# Patient Record
Sex: Female | Born: 1975 | Race: White | Hispanic: Yes | Marital: Married | Smoking: Never smoker
Health system: Southern US, Community
[De-identification: ages and names within clinical notes are randomized; demographics above are authoritative.]

## PROBLEM LIST (undated history)

## (undated) DIAGNOSIS — F329 Major depressive disorder, single episode, unspecified: Secondary | ICD-10-CM

## (undated) DIAGNOSIS — F419 Anxiety disorder, unspecified: Secondary | ICD-10-CM

## (undated) DIAGNOSIS — F32A Depression, unspecified: Secondary | ICD-10-CM

## (undated) DIAGNOSIS — K219 Gastro-esophageal reflux disease without esophagitis: Secondary | ICD-10-CM

## (undated) DIAGNOSIS — E079 Disorder of thyroid, unspecified: Secondary | ICD-10-CM

## (undated) DIAGNOSIS — Z8632 Personal history of gestational diabetes: Secondary | ICD-10-CM

## (undated) HISTORY — PX: BREAST REDUCTION SURGERY: SHX8

## (undated) HISTORY — DX: Personal history of gestational diabetes: Z86.32

## (undated) HISTORY — PX: SPINE SURGERY: SHX786

## (undated) HISTORY — DX: Anxiety disorder, unspecified: F41.9

## (undated) HISTORY — DX: Major depressive disorder, single episode, unspecified: F32.9

## (undated) HISTORY — DX: Gastro-esophageal reflux disease without esophagitis: K21.9

## (undated) HISTORY — PX: MOUTH SURGERY: SHX715

## (undated) HISTORY — DX: Depression, unspecified: F32.A

---

## 2006-08-01 HISTORY — PX: REDUCTION MAMMAPLASTY: SUR839

## 2014-08-06 ENCOUNTER — Encounter (HOSPITAL_COMMUNITY): Payer: Self-pay

## 2014-08-06 ENCOUNTER — Emergency Department (HOSPITAL_COMMUNITY)
Admission: EM | Admit: 2014-08-06 | Discharge: 2014-08-07 | Disposition: A | Discharge status: Home / Self Care | Payer: BLUE CROSS/BLUE SHIELD | Attending: Emergency Medicine | Admitting: Emergency Medicine

## 2014-08-06 ENCOUNTER — Emergency Department (HOSPITAL_COMMUNITY): Payer: BLUE CROSS/BLUE SHIELD

## 2014-08-06 DIAGNOSIS — Z3202 Encounter for pregnancy test, result negative: Secondary | ICD-10-CM | POA: Diagnosis not present

## 2014-08-06 DIAGNOSIS — Z8639 Personal history of other endocrine, nutritional and metabolic disease: Secondary | ICD-10-CM | POA: Diagnosis not present

## 2014-08-06 DIAGNOSIS — R0789 Other chest pain: Secondary | ICD-10-CM | POA: Diagnosis not present

## 2014-08-06 DIAGNOSIS — R079 Chest pain, unspecified: Secondary | ICD-10-CM | POA: Diagnosis present

## 2014-08-06 HISTORY — DX: Disorder of thyroid, unspecified: E07.9

## 2014-08-06 LAB — BASIC METABOLIC PANEL
Anion gap: 5 (ref 5–15)
BUN: 16 mg/dL (ref 6–23)
CALCIUM: 8.7 mg/dL (ref 8.4–10.5)
CO2: 24 mmol/L (ref 19–32)
Chloride: 105 mEq/L (ref 96–112)
Creatinine, Ser: 0.75 mg/dL (ref 0.50–1.10)
GFR calc Af Amer: >90 mL/min (ref >90)
GFR calc non Af Amer: >90 mL/min (ref >90)
GLUCOSE: 115 mg/dL — AB (ref 70–99)
Potassium: 3.4 mmol/L — ABNORMAL LOW (ref 3.5–5.1)
SODIUM: 134 mmol/L — AB (ref 135–145)

## 2014-08-06 LAB — I-STAT TROPONIN, ED: Troponin i, poc: 0 ng/mL (ref 0.00–0.08)

## 2014-08-06 LAB — CBC
HEMATOCRIT: 37.5 % (ref 36.0–46.0)
Hemoglobin: 12.2 g/dL (ref 12.0–15.0)
MCH: 28.3 pg (ref 26.0–34.0)
MCHC: 32.5 g/dL (ref 30.0–36.0)
MCV: 87 fL (ref 78.0–100.0)
PLATELETS: 222 10*3/uL (ref 150–400)
RBC: 4.31 MIL/uL (ref 3.87–5.11)
RDW: 12.7 % (ref 11.5–15.5)
WBC: 7.8 10*3/uL (ref 4.0–10.5)

## 2014-08-06 LAB — POC URINE PREG, ED: Preg Test, Ur: NEGATIVE

## 2014-08-06 MED ORDER — GI COCKTAIL ~~LOC~~
30.0000 mL | Freq: Once | ORAL | Status: AC
  Administered 2014-08-06: 30 mL via ORAL
  Filled 2014-08-06: qty 30

## 2014-08-06 MED ORDER — IBUPROFEN 200 MG PO TABS
400.0000 mg | ORAL_TABLET | Freq: Once | ORAL | Status: AC
  Administered 2014-08-06: 400 mg via ORAL
  Filled 2014-08-06: qty 2

## 2014-08-06 MED ORDER — FAMOTIDINE 20 MG PO TABS
20.0000 mg | ORAL_TABLET | Freq: Once | ORAL | Status: AC
  Administered 2014-08-06: 20 mg via ORAL
  Filled 2014-08-06: qty 1

## 2014-08-06 NOTE — ED Provider Notes (Signed)
CSN: [id number]     Arrival date & time 08/06/14  2153 History   First MD Initiated Contact with Patient 08/06/14 2215     Chief Complaint  Patient presents with  . Chest Pain     (Consider location/radiation/quality/duration/timing/severity/associated sxs/prior Treatment) Patient is a 39 y.o. female presenting with chest pain. The history is provided by the patient.  Chest Pain Associated symptoms: no abdominal pain, no back pain, no cough, no fever, no headache, no nausea, no palpitations, no shortness of breath and not vomiting   pt c/o sharp, localized, pinching type pain lower sternum, just right of midline. Constant. Dull. Non radiating. At times worse w changes in position/certain movements. At another point felt like moved up towards throat. Has been constant, at rest, although states did not interfere w her normal sleep last night.  No relation to activity or exertion. No change whether sitting upright or lying supine. No relation to eating. No hx same pain. Pain is not pleuritic. No associated sob, nv or diaphoresis. No unusual doe or fatigue. Pt denies hx gerd or heartburn. No recent febrile/viral illness. No cough or uri c/o.  Pt denies leg pain or swelling. No recent surgery, immobility or trauma. Non smoker. No hx dvt or pe. No fam hx cad. Pt denies chest wall injury or strain. Pt denies abd pain. No hx gallstones.   Past Medical History  Diagnosis Date  . Thyroid disease    History reviewed. No pertinent past surgical history. History reviewed. No pertinent family history. History  Substance Use Topics  . Smoking status: Never Smoker   . Smokeless tobacco: Not on file  . Alcohol Use: No   OB History    No data available     Review of Systems  Constitutional: Negative for fever and chills.  HENT: Negative for sore throat.   Eyes: Negative for redness.  Respiratory: Negative for cough and shortness of breath.   Cardiovascular: Positive for chest pain. Negative for  palpitations and leg swelling.  Gastrointestinal: Negative for nausea, vomiting and abdominal pain.  Genitourinary: Negative for flank pain.  Musculoskeletal: Negative for back pain and neck pain.  Skin: Negative for rash.  Neurological: Negative for headaches.  Hematological: Does not bruise/bleed easily.  Psychiatric/Behavioral: Negative for confusion.      Allergies  Review of patient's allergies indicates no known allergies.  Home Medications   Prior to Admission medications   Not on File   BP 127/72 mmHg  Pulse 85  Temp(Src) 97.6 F (36.4 C) (Oral)  Resp 18  Ht  (1.651 m)  Wt 170 lb (77.111 kg)  BMI 28.29 kg/m2  SpO2 100%  LMP 08/06/2014 Physical Exam  Constitutional: She is oriented to person, place, and time. She appears well-developed and well-nourished. No distress.  Eyes: Conjunctivae are normal. No scleral icterus.  Neck: Neck supple. No tracheal deviation present.  Cardiovascular: Normal rate, regular rhythm, normal heart sounds and intact distal pulses.  Exam reveals no gallop and no friction rub.   No murmur heard. Pulmonary/Chest: Effort normal and breath sounds normal. No respiratory distress. She exhibits tenderness.  Abdominal: Soft. Normal appearance and bowel sounds are normal. She exhibits no distension and no mass. There is no tenderness. There is no rebound and no guarding.  Musculoskeletal: She exhibits no edema or tenderness.  Neurological: She is alert and oriented to person, place, and time.  Skin: Skin is warm and dry. No rash noted. She is not diaphoretic.  Psychiatric: She has  a normal mood and affect.  Nursing note and vitals reviewed.   ED Course  Procedures (including critical care time) Labs Review  Results for orders placed or performed during the hospital encounter of 08/06/14  CBC  Result Value Ref Range   WBC 7.8 4.0 - 10.5 K/uL   RBC 4.31 3.87 - 5.11 MIL/uL   Hemoglobin 12.2 12.0 - 15.0 g/dL   HCT 16.137.5 09.636.0 - 04.546.0 %    MCV 87.0 78.0 - 100.0 fL   MCH 28.3 26.0 - 34.0 pg   MCHC 32.5 30.0 - 36.0 g/dL   RDW 40.912.7 81.111.5 - 91.415.5 %   Platelets 222 150 - 400 K/uL  I-stat troponin, ED (not [facility])  Result Value Ref Range   Troponin i, poc 0.00 0.00 - 0.08 ng/mL   Comment 3          POC Urine Pregnancy, ED (pre-menopausal females) - do not order at [facility]  Result Value Ref Range   Preg Test, Ur NEGATIVE NEGATIVE   Dg Chest Port 1 View  08/06/2014   CLINICAL DATA:  Mid upper chest pain for several hours.  EXAM: PORTABLE CHEST - 1 VIEW  COMPARISON:  None.  FINDINGS: Calcified granuloma at the left lung base. The cardiomediastinal contours are normal. The lungs are clear. Pulmonary vasculature is normal. No consolidation, pleural effusion, or pneumothorax. No acute osseous abnormalities are seen.  IMPRESSION: No acute pulmonary process. Calcified granuloma at the left lung base.   Electronically Signed   By: Rubye OaksMelanie  Ehinger M.D.   On: 08/06/2014 22:35       EKG Interpretation   Date/Time:  Wednesday August 06 2014 22:03:05 EST Ventricular Rate:  71 PR Interval:  144 QRS Duration: 102 QT Interval:  401 QTC Calculation: 436 R Axis:   61 Text Interpretation:  Sinus rhythm No previous tracing Confirmed by Denton LankSTEINL   MD, Caryn BeeKEVIN ([id number]) on 08/06/2014 10:26:07 PM      MDM   Labs. Cxr.  Reviewed nursing notes and prior charts for additional history.   Will try motrin, pepcid and gi cocktail for symptom relief/improvement.  After symptoms since yesterday, trop neg/zero.  Pt comfortable, and appears stable for d/c.     Suzi RootsKevin E Mannie Ohlin, MD 08/06/14 [phone]

## 2014-08-06 NOTE — Discharge Instructions (Signed)
It was our pleasure to provide your ER care today - we hope that you feel better.  Take motrin or aleve as need for pain.  Follow up with primary care doctor in the coming week.  Return to ER right away if worse, persistent or recurrent chest pain, trouble breathing, severe abdominal pain, fevers, other concern.      Chest Pain (Nonspecific) It is often hard to give a specific diagnosis for the cause of chest pain. There is always a chance that your pain could be related to something serious, such as a heart attack or a blood clot in the lungs. You need to follow up with your health care provider for further evaluation. CAUSES   Heartburn.  Pneumonia or bronchitis.  Anxiety or stress.  Inflammation around your heart (pericarditis) or lung (pleuritis or pleurisy).  A blood clot in the lung.  A collapsed lung (pneumothorax). It can develop suddenly on its own (spontaneous pneumothorax) or from trauma to the chest.  Shingles infection (herpes zoster virus). The chest wall is composed of bones, muscles, and cartilage. Any of these can be the source of the pain.  The bones can be bruised by injury.  The muscles or cartilage can be strained by coughing or overwork.  The cartilage can be affected by inflammation and become sore (costochondritis). DIAGNOSIS  Lab tests or other studies may be needed to find the cause of your pain. Your health care provider may have you take a test called an ambulatory electrocardiogram (ECG). An ECG records your heartbeat patterns over a 24-hour period. You may also have other tests, such as:  Transthoracic echocardiogram (TTE). During echocardiography, sound waves are used to evaluate how blood flows through your heart.  Transesophageal echocardiogram (TEE).  Cardiac monitoring. This allows your health care provider to monitor your heart rate and rhythm in real time.  Holter monitor. This is a portable device that records your heartbeat and can  help diagnose heart arrhythmias. It allows your health care provider to track your heart activity for several days, if needed.  Stress tests by exercise or by giving medicine that makes the heart beat faster. TREATMENT   Treatment depends on what may be causing your chest pain. Treatment may include:  Acid blockers for heartburn.  Anti-inflammatory medicine.  Pain medicine for inflammatory conditions.  Antibiotics if an infection is present.  You may be advised to change lifestyle habits. This includes stopping smoking and avoiding alcohol, caffeine, and chocolate.  You may be advised to keep your head raised (elevated) when sleeping. This reduces the chance of acid going backward from your stomach into your esophagus. Most of the time, nonspecific chest pain will improve within 2-3 days with rest and mild pain medicine.  HOME CARE INSTRUCTIONS   If antibiotics were prescribed, take them as directed. Finish them even if you start to feel better.  For the next few days, avoid physical activities that bring on chest pain. Continue physical activities as directed.  Do not use any tobacco products, including cigarettes, chewing tobacco, or electronic cigarettes.  Avoid drinking alcohol.  Only take medicine as directed by your health care provider.  Follow your health care provider's suggestions for further testing if your chest pain does not go away.  Keep any follow-up appointments you made. If you do not go to an appointment, you could develop lasting (chronic) problems with pain. If there is any problem keeping an appointment, call to reschedule. SEEK MEDICAL CARE IF:   Your  chest pain does not go away, even after treatment.  You have a rash with blisters on your chest.  You have a fever. SEEK IMMEDIATE MEDICAL CARE IF:   You have increased chest pain or pain that spreads to your arm, neck, jaw, back, or abdomen.  You have shortness of breath.  You have an increasing  cough, or you cough up blood.  You have severe back or abdominal pain.  You feel nauseous or vomit.  You have severe weakness.  You faint.  You have chills. This is an emergency. Do not wait to see if the pain will go away. Get medical help at once. Call your local emergency services (911 in [place] Do not drive yourself to the hospital. MAKE SURE YOU:   Understand these instructions.  Will watch your condition.  Will get help right away if you are not doing well or get worse. Document Released: 04/27/2005 Document Revised: 07/23/2013 Document Reviewed: 02/21/2008 [facility] Patient Information 2015 [place], [place]. This information is not intended to replace advice given to you by your health care provider. Make sure you discuss any questions you have with your health care provider.    Chest Wall Pain Chest wall pain is pain in or around the bones and muscles of your chest. It may take up to 6 weeks to get better. It may take longer if you must stay physically active in your work and activities.  CAUSES  Chest wall pain may happen on its own. However, it may be caused by:  A viral illness like the flu.  Injury.  Coughing.  Exercise.  Arthritis.  Fibromyalgia.  Shingles. HOME CARE INSTRUCTIONS   Avoid overtiring physical activity. Try not to strain or perform activities that cause pain. This includes any activities using your chest or your abdominal and side muscles, especially if heavy weights are used.  Put ice on the sore area.  Put ice in a plastic bag.  Place a towel between your skin and the bag.  Leave the ice on for 15-20 minutes per hour while awake for the first 2 days.  Only take over-the-counter or prescription medicines for pain, discomfort, or fever as directed by your caregiver. SEEK IMMEDIATE MEDICAL CARE IF:   Your pain increases, or you are very uncomfortable.  You have a fever.  Your chest pain becomes worse.  You have new, unexplained  symptoms.  You have nausea or vomiting.  You feel sweaty or lightheaded.  You have a cough with phlegm (sputum), or you cough up blood. MAKE SURE YOU:   Understand these instructions.  Will watch your condition.  Will get help right away if you are not doing well or get worse. Document Released: 07/18/2005 Document Revised: 10/10/2011 Document Reviewed: 03/14/2011 [facility] Patient Information 2015 [place], [place]. This information is not intended to replace advice given to you by your health care provider. Make sure you discuss any questions you have with your health care provider.

## 2014-08-06 NOTE — ED Notes (Signed)
Pt complains of centralized chest pain since yesterday, she states it's a pinching pain and sometimes feels like it's in her throat

## 2015-03-04 ENCOUNTER — Other Ambulatory Visit: Payer: Self-pay | Admitting: Family Medicine

## 2015-03-04 DIAGNOSIS — Z1231 Encounter for screening mammogram for malignant neoplasm of breast: Secondary | ICD-10-CM

## 2015-04-15 ENCOUNTER — Ambulatory Visit
Admission: RE | Admit: 2015-04-15 | Discharge: 2015-04-15 | Disposition: A | Discharge status: Home / Self Care | Payer: BLUE CROSS/BLUE SHIELD | Source: Ambulatory Visit | Attending: Family Medicine | Admitting: Family Medicine

## 2015-04-15 DIAGNOSIS — Z1231 Encounter for screening mammogram for malignant neoplasm of breast: Secondary | ICD-10-CM

## 2015-04-15 LAB — HM MAMMOGRAPHY

## 2015-12-08 DIAGNOSIS — B9789 Other viral agents as the cause of diseases classified elsewhere: Secondary | ICD-10-CM | POA: Diagnosis not present

## 2015-12-08 DIAGNOSIS — M940 Chondrocostal junction syndrome [Tietze]: Secondary | ICD-10-CM | POA: Diagnosis not present

## 2015-12-08 DIAGNOSIS — J069 Acute upper respiratory infection, unspecified: Secondary | ICD-10-CM | POA: Diagnosis not present

## 2016-03-23 ENCOUNTER — Other Ambulatory Visit: Payer: Self-pay | Admitting: Physician Assistant

## 2016-03-23 DIAGNOSIS — R51 Headache: Principal | ICD-10-CM

## 2016-03-23 DIAGNOSIS — R5383 Other fatigue: Secondary | ICD-10-CM | POA: Diagnosis not present

## 2016-03-23 DIAGNOSIS — E034 Atrophy of thyroid (acquired): Secondary | ICD-10-CM | POA: Diagnosis not present

## 2016-03-23 DIAGNOSIS — R519 Headache, unspecified: Secondary | ICD-10-CM

## 2016-03-23 DIAGNOSIS — M722 Plantar fascial fibromatosis: Secondary | ICD-10-CM | POA: Diagnosis not present

## 2016-03-24 ENCOUNTER — Other Ambulatory Visit: Payer: BLUE CROSS/BLUE SHIELD

## 2016-03-24 ENCOUNTER — Ambulatory Visit
Admission: RE | Admit: 2016-03-24 | Discharge: 2016-03-24 | Disposition: A | Discharge status: Home / Self Care | Payer: BLUE CROSS/BLUE SHIELD | Source: Ambulatory Visit | Attending: Physician Assistant | Admitting: Physician Assistant

## 2016-03-24 DIAGNOSIS — R51 Headache: Secondary | ICD-10-CM | POA: Diagnosis not present

## 2016-03-24 DIAGNOSIS — R519 Headache, unspecified: Secondary | ICD-10-CM

## 2016-03-25 LAB — BASIC METABOLIC PANEL
BUN: 12 mg/dL (ref 4–21)
Creatinine: 0.9 mg/dL (ref 0.5–1.1)
GLUCOSE: 120 mg/dL
POTASSIUM: 4.4 mmol/L (ref 3.4–5.3)
Sodium: 139 mmol/L (ref 137–147)

## 2016-03-25 LAB — HEPATIC FUNCTION PANEL
ALT: 15 U/L (ref 7–35)
AST: 14 U/L (ref 13–35)
Alkaline Phosphatase: 62 U/L (ref 25–125)
Bilirubin, Total: 0.4 mg/dL

## 2016-03-25 LAB — CBC AND DIFFERENTIAL
HCT: 41 % (ref 36–46)
Hemoglobin: 13.7 g/dL (ref 12.0–16.0)
Neutrophils Absolute: 6 /uL
Platelets: 230 10*3/uL (ref 150–399)
WBC: 8.5 10^3/mL

## 2016-04-20 ENCOUNTER — Encounter: Payer: Self-pay | Admitting: General Practice

## 2016-04-28 DIAGNOSIS — E039 Hypothyroidism, unspecified: Secondary | ICD-10-CM | POA: Diagnosis not present

## 2016-06-01 LAB — HM PAP SMEAR

## 2016-06-22 DIAGNOSIS — Z01419 Encounter for gynecological examination (general) (routine) without abnormal findings: Secondary | ICD-10-CM | POA: Diagnosis not present

## 2016-06-22 DIAGNOSIS — Z130 Encounter for screening for diseases of the blood and blood-forming organs and certain disorders involving the immune mechanism: Secondary | ICD-10-CM | POA: Diagnosis not present

## 2016-06-22 DIAGNOSIS — Z3041 Encounter for surveillance of contraceptive pills: Secondary | ICD-10-CM | POA: Diagnosis not present

## 2016-06-22 DIAGNOSIS — Z1231 Encounter for screening mammogram for malignant neoplasm of breast: Secondary | ICD-10-CM | POA: Diagnosis not present

## 2016-06-22 DIAGNOSIS — Z1389 Encounter for screening for other disorder: Secondary | ICD-10-CM | POA: Diagnosis not present

## 2016-06-22 DIAGNOSIS — Z6829 Body mass index (BMI) 29.0-29.9, adult: Secondary | ICD-10-CM | POA: Diagnosis not present

## 2016-08-04 DIAGNOSIS — E039 Hypothyroidism, unspecified: Secondary | ICD-10-CM | POA: Diagnosis not present

## 2016-08-18 ENCOUNTER — Ambulatory Visit: Payer: BLUE CROSS/BLUE SHIELD | Admitting: Family Medicine

## 2016-09-07 ENCOUNTER — Encounter: Payer: Self-pay | Admitting: Family Medicine

## 2016-09-07 ENCOUNTER — Ambulatory Visit (INDEPENDENT_AMBULATORY_CARE_PROVIDER_SITE_OTHER): Payer: BLUE CROSS/BLUE SHIELD | Admitting: Family Medicine

## 2016-09-07 VITALS — BP 112/80 | HR 76 | Temp 98.7°F | Resp 16 | Ht 65.0 in | Wt 178.5 lb

## 2016-09-07 DIAGNOSIS — Z0000 Encounter for general adult medical examination without abnormal findings: Secondary | ICD-10-CM | POA: Diagnosis not present

## 2016-09-07 DIAGNOSIS — G8929 Other chronic pain: Secondary | ICD-10-CM

## 2016-09-07 DIAGNOSIS — M545 Low back pain, unspecified: Secondary | ICD-10-CM | POA: Insufficient documentation

## 2016-09-07 LAB — LIPID PANEL
Cholesterol: 191 mg/dL (ref 0–200)
HDL: 47.4 mg/dL (ref >39.00)
LDL CALC: 114 mg/dL — AB (ref 0–99)
NONHDL: 143.89
Total CHOL/HDL Ratio: 4
Triglycerides: 147 mg/dL (ref 0.0–149.0)
VLDL: 29.4 mg/dL (ref 0.0–40.0)

## 2016-09-07 LAB — CBC WITH DIFFERENTIAL/PLATELET
BASOS PCT: 0.6 % (ref 0.0–3.0)
Basophils Absolute: 0 10*3/uL (ref 0.0–0.1)
Eosinophils Absolute: 0.1 10*3/uL (ref 0.0–0.7)
Eosinophils Relative: 1.9 % (ref 0.0–5.0)
HCT: 39.9 % (ref 36.0–46.0)
Hemoglobin: 13.4 g/dL (ref 12.0–15.0)
Lymphocytes Relative: 32 % (ref 12.0–46.0)
Lymphs Abs: 2.4 10*3/uL (ref 0.7–4.0)
MCHC: 33.6 g/dL (ref 30.0–36.0)
MCV: 86.6 fl (ref 78.0–100.0)
MONOS PCT: 6.9 % (ref 3.0–12.0)
Monocytes Absolute: 0.5 10*3/uL (ref 0.1–1.0)
NEUTROS ABS: 4.5 10*3/uL (ref 1.4–7.7)
NEUTROS PCT: 58.6 % (ref 43.0–77.0)
PLATELETS: 253 10*3/uL (ref 150.0–400.0)
RBC: 4.61 Mil/uL (ref 3.87–5.11)
RDW: 12.9 % (ref 11.5–15.5)
WBC: 7.6 10*3/uL (ref 4.0–10.5)

## 2016-09-07 LAB — BASIC METABOLIC PANEL
BUN: 10 mg/dL (ref 6–23)
CO2: 25 mEq/L (ref 19–32)
Calcium: 8.9 mg/dL (ref 8.4–10.5)
Chloride: 105 mEq/L (ref 96–112)
Creatinine, Ser: 0.76 mg/dL (ref 0.40–1.20)
GFR: 88.28 mL/min (ref >60.00)
Glucose, Bld: 98 mg/dL (ref 70–99)
POTASSIUM: 3.9 meq/L (ref 3.5–5.1)
Sodium: 137 mEq/L (ref 135–145)

## 2016-09-07 LAB — HEPATIC FUNCTION PANEL
ALT: 17 U/L (ref 0–35)
AST: 17 U/L (ref 0–37)
Albumin: 4.1 g/dL (ref 3.5–5.2)
Alkaline Phosphatase: 66 U/L (ref 39–117)
BILIRUBIN DIRECT: 0.1 mg/dL (ref 0.0–0.3)
BILIRUBIN TOTAL: 0.4 mg/dL (ref 0.2–1.2)
Total Protein: 6.8 g/dL (ref 6.0–8.3)

## 2016-09-07 LAB — VITAMIN D 25 HYDROXY (VIT D DEFICIENCY, FRACTURES): VITD: 19.05 ng/mL — ABNORMAL LOW (ref 30.00–100.00)

## 2016-09-07 NOTE — Patient Instructions (Signed)
Follow up in 1 year or as needed We'll notify you of your lab results and make any changes if needed We'll call you with your Spine appt Continue to work on healthy diet and regular exercise- you look great! Call with any questions or concerns Welcome!  We're glad to have you!!! Happy Early [place] OuchBirthday!!!

## 2016-09-07 NOTE — Progress Notes (Signed)
Pre visit review using our clinic review tool, if applicable. No additional management support is needed unless otherwise documented below in the visit note. 

## 2016-09-07 NOTE — Progress Notes (Signed)
   Subjective:    Patient ID: Tamara Gates, female    DOB: 02-01-1976, 41 y.o.   MRN: [id number]  HPI New to establish.  Previous MD- Doristine CounterBurnett   GYN- Senaida Oresichardson  EndoKatrinka Blazing- [place]  Hypothyroid- chronic problem, on Levothyroxine and Cytomel.  Chronic low back pain- s/p L3/4 fx in college.  Continues to have intermittent LBP.  TTP.   Review of Systems Patient reports no vision/ hearing changes, adenopathy,fever, weight change,  persistant/recurrent hoarseness , swallowing issues, chest pain, palpitations, edema, persistant/recurrent cough, hemoptysis, dyspnea (rest/exertional/paroxysmal nocturnal), gastrointestinal bleeding (melena, rectal bleeding), abdominal pain, significant heartburn, bowel changes, GU symptoms (dysuria, hematuria, incontinence), Gyn symptoms (abnormal  bleeding, pain),  syncope, focal weakness, memory loss, numbness & tingling, skin/hair/nail changes, abnormal bruising or bleeding, anxiety, or depression.     Objective:   Physical Exam General Appearance:    Alert, cooperative, no distress, appears stated age  Head:    Normocephalic, without obvious abnormality, atraumatic  Eyes:    PERRL, conjunctiva/corneas clear, EOM's intact, fundi    benign, both eyes  Ears:    Normal TM's and external ear canals, both ears  Nose:   Nares normal, septum midline, mucosa normal, no drainage    or sinus tenderness  Throat:   Lips, mucosa, and tongue normal; teeth and gums normal  Neck:   Supple, symmetrical, trachea midline, no adenopathy;    Thyroid: no enlargement/tenderness/nodules  Back:     Symmetric, no curvature, ROM normal, no CVA tenderness  Lungs:     Clear to auscultation bilaterally, respirations unlabored  Chest Wall:    No tenderness or deformity   Heart:    Regular rate and rhythm, S1 and S2 normal, no murmur, rub   or gallop  Breast Exam:    Deferred to GYN  Abdomen:     Soft, non-tender, bowel sounds active all four quadrants,    no masses, no organomegaly    Genitalia:    Deferred to GYN  Rectal:    Extremities:   Extremities normal, atraumatic, no cyanosis or edema  Pulses:   2+ and symmetric all extremities  Skin:   Skin color, texture, turgor normal, no rashes or lesions  Lymph nodes:   Cervical, supraclavicular, and axillary nodes normal  Neurologic:   CNII-XII intact, normal strength, sensation and reflexes    throughout          Assessment & Plan:  PE- pt's PE WNL w/ exception of being overweight.  UTD on GYN.  Check labs.  Anticipatory guidance provided.

## 2016-09-08 ENCOUNTER — Other Ambulatory Visit: Payer: Self-pay | Admitting: General Practice

## 2016-09-08 MED ORDER — VITAMIN D (ERGOCALCIFEROL) 1.25 MG (50000 UNIT) PO CAPS: 50000.0000 [IU] | ORAL_CAPSULE | ORAL | 0 refills | Status: DC

## 2016-09-15 ENCOUNTER — Encounter: Payer: Self-pay | Admitting: Family Medicine

## 2016-09-15 NOTE — Telephone Encounter (Signed)
Called pt to inform her that the referral coordinator at [facility] was out today and that I would be back in touch with her by Monday.

## 2016-10-26 DIAGNOSIS — M545 Low back pain: Secondary | ICD-10-CM | POA: Diagnosis not present

## 2016-10-26 DIAGNOSIS — M549 Dorsalgia, unspecified: Secondary | ICD-10-CM | POA: Diagnosis not present

## 2016-10-26 DIAGNOSIS — M4316 Spondylolisthesis, lumbar region: Secondary | ICD-10-CM | POA: Diagnosis not present

## 2016-10-26 DIAGNOSIS — M47896 Other spondylosis, lumbar region: Secondary | ICD-10-CM | POA: Diagnosis not present

## 2016-11-12 ENCOUNTER — Other Ambulatory Visit: Payer: Self-pay | Admitting: Family Medicine

## 2016-11-12 ENCOUNTER — Encounter: Payer: Self-pay | Admitting: Family Medicine

## 2016-11-14 DIAGNOSIS — D229 Melanocytic nevi, unspecified: Secondary | ICD-10-CM | POA: Diagnosis not present

## 2016-11-14 DIAGNOSIS — L820 Inflamed seborrheic keratosis: Secondary | ICD-10-CM | POA: Diagnosis not present

## 2016-11-14 DIAGNOSIS — L814 Other melanin hyperpigmentation: Secondary | ICD-10-CM | POA: Diagnosis not present

## 2016-12-02 DIAGNOSIS — E039 Hypothyroidism, unspecified: Secondary | ICD-10-CM | POA: Diagnosis not present

## 2017-03-13 ENCOUNTER — Telehealth: Payer: Self-pay | Admitting: Family Medicine

## 2017-03-13 NOTE — Telephone Encounter (Signed)
Tamara Gates with [facility] calling to inquire about multiple fax requests sent to office for new medication(pain med alternative).  She states she has not receive a response back from office.  Please call her back at [phone] with status of request.

## 2017-03-13 NOTE — Telephone Encounter (Signed)
Pt had refused. Paperwork sent back to advise

## 2017-04-07 ENCOUNTER — Ambulatory Visit (INDEPENDENT_AMBULATORY_CARE_PROVIDER_SITE_OTHER): Payer: BLUE CROSS/BLUE SHIELD | Admitting: Family Medicine

## 2017-04-07 ENCOUNTER — Encounter: Payer: Self-pay | Admitting: Family Medicine

## 2017-04-07 DIAGNOSIS — E663 Overweight: Secondary | ICD-10-CM

## 2017-04-07 DIAGNOSIS — F419 Anxiety disorder, unspecified: Secondary | ICD-10-CM | POA: Diagnosis not present

## 2017-04-07 DIAGNOSIS — F329 Major depressive disorder, single episode, unspecified: Secondary | ICD-10-CM | POA: Diagnosis not present

## 2017-04-07 DIAGNOSIS — E162 Hypoglycemia, unspecified: Secondary | ICD-10-CM | POA: Diagnosis not present

## 2017-04-07 DIAGNOSIS — F32A Depression, unspecified: Secondary | ICD-10-CM | POA: Insufficient documentation

## 2017-04-07 DIAGNOSIS — E039 Hypothyroidism, unspecified: Secondary | ICD-10-CM

## 2017-04-07 DIAGNOSIS — E669 Obesity, unspecified: Secondary | ICD-10-CM | POA: Insufficient documentation

## 2017-04-07 DIAGNOSIS — E66811 Obesity, class 1: Secondary | ICD-10-CM | POA: Insufficient documentation

## 2017-04-07 MED ORDER — CITALOPRAM HYDROBROMIDE 10 MG PO TABS: 10.0000 mg | ORAL_TABLET | Freq: Every day | ORAL | 3 refills | Status: DC

## 2017-04-07 NOTE — Progress Notes (Signed)
   Subjective:    Patient ID: Tamara Gates, female    DOB: 1976/06/09, 41 y.o.   MRN: [id number]  HPI Depression- pt reports she has been having mood swings recently.  Pt reports feeling tearful 'all the time', 'i just want to go to bed'.  Overweight- pt is attempting to eliminate gluten from diet in order to lose weight.  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  Then developed gestational diabetes w/ 1st pregnancy but not 2nd.  When pt is hungry 'head will get blurry', fingers get cold.  Pt will develop palpitations after eating sugar.  Pt has been seeing Dr Katrinka BlazingSmith for Endo due to hypothyroid.   Review of Systems For ROS see HPI     Objective:   Physical Exam  Constitutional: She is oriented to person, place, and time. She appears well-developed and well-nourished.  overweight  Neurological: She is alert and oriented to person, place, and time.  Skin: Skin is warm and dry.  Psychiatric: Her behavior is normal. Thought content normal.  Tearful, anxious  Vitals reviewed.         Assessment & Plan:

## 2017-04-07 NOTE — Assessment & Plan Note (Signed)
New.  Pt has hx of hypoglycemia induced syncope and gestational diabetes.  Continues to have some sxs of hypoglycemia- dizziness, cold fingers, mood swings, shaky feelings.  Reviewed dietary modifications and check A1C.  Will follow closely.

## 2017-04-07 NOTE — Assessment & Plan Note (Signed)
Ongoing issue for pt.  Stressed need for healthy diet and regular exercise.  Check labs.  Will follow. 

## 2017-04-07 NOTE — Assessment & Plan Note (Signed)
New for provider.  Pt has hx of post-partum depression but has been doing well until recently.  Start low dose Celexa and monitor closely for improvement.  Pt expressed understanding and is in agreement w/ plan.

## 2017-04-07 NOTE — Progress Notes (Signed)
Pre visit review using our clinic review tool, if applicable. No additional management support is needed unless otherwise documented below in the visit note. 

## 2017-04-07 NOTE — Assessment & Plan Note (Signed)
Check labs and determine if medication needs to be adjusted given her difficulties w/ losing weight.  Pt expressed understanding and is in agreement w/ plan.

## 2017-04-07 NOTE — Patient Instructions (Signed)
Follow up in 3-4 weeks to recheck mood We'll notify you of your lab results and make any changes if needed Start the Citalopram daily to improve mood Continue to work on healthy diet and regular exercise- you can do it! Make sure you are eating regularly, including snacks, and try and eat protein at every meal to avoid fluctuating blood sugars Call with any questions or concerns Hang in there!  We'll get this figured out!!!

## 2017-04-08 LAB — HEMOGLOBIN A1C
Hgb A1c MFr Bld: 5.7 % of total Hgb — ABNORMAL HIGH (ref <5.7)
MEAN PLASMA GLUCOSE: 117 (calc)
eAG (mmol/L): 6.5 (calc)

## 2017-04-08 LAB — BASIC METABOLIC PANEL WITH GFR
BUN: 12 mg/dL (ref 7–25)
CALCIUM: 8.9 mg/dL (ref 8.6–10.2)
CO2: 25 mmol/L (ref 20–32)
CREATININE: 0.99 mg/dL (ref 0.50–1.10)
Chloride: 104 mmol/L (ref 98–110)
GFR, Est African American: 81 mL/min/{1.73_m2} (ref >=60)
GFR, Est Non African American: 70 mL/min/{1.73_m2} (ref >=60)
GLUCOSE: 114 mg/dL — AB (ref 65–99)
POTASSIUM: 3.9 mmol/L (ref 3.5–5.3)
SODIUM: 138 mmol/L (ref 135–146)

## 2017-04-08 LAB — CBC WITH DIFFERENTIAL/PLATELET
BASOS PCT: 0.3 %
Basophils Absolute: 23 cells/uL (ref 0–200)
Eosinophils Absolute: 90 cells/uL (ref 15–500)
Eosinophils Relative: 1.2 %
HCT: 38.2 % (ref 35.0–45.0)
HEMOGLOBIN: 13 g/dL (ref 11.7–15.5)
Lymphs Abs: 1935 cells/uL (ref 850–3900)
MCH: 28.9 pg (ref 27.0–33.0)
MCHC: 34 g/dL (ref 32.0–36.0)
MCV: 84.9 fL (ref 80.0–100.0)
MPV: 11.1 fL (ref 7.5–12.5)
Monocytes Relative: 5.4 %
NEUTROS ABS: 5048 {cells}/uL (ref 1500–7800)
Neutrophils Relative %: 67.3 %
PLATELETS: 238 10*3/uL (ref 140–400)
RBC: 4.5 10*6/uL (ref 3.80–5.10)
RDW: 12.8 % (ref 11.0–15.0)
TOTAL LYMPHOCYTE: 25.8 %
WBC: 7.5 10*3/uL (ref 3.8–10.8)
WBCMIX: 405 {cells}/uL (ref 200–950)

## 2017-04-08 LAB — T4, FREE: Free T4: 1.2 ng/dL (ref 0.8–1.8)

## 2017-04-08 LAB — TSH: TSH: 0.9 mIU/L

## 2017-04-08 LAB — T3, FREE: T3 FREE: 3 pg/mL (ref 2.3–4.2)

## 2017-04-10 ENCOUNTER — Encounter: Payer: Self-pay | Admitting: General Practice

## 2017-05-05 ENCOUNTER — Ambulatory Visit: Payer: BLUE CROSS/BLUE SHIELD | Admitting: Family Medicine

## 2017-05-11 ENCOUNTER — Ambulatory Visit (INDEPENDENT_AMBULATORY_CARE_PROVIDER_SITE_OTHER): Payer: BLUE CROSS/BLUE SHIELD | Admitting: Family Medicine

## 2017-05-11 ENCOUNTER — Encounter: Payer: Self-pay | Admitting: Family Medicine

## 2017-05-11 VITALS — BP 120/80 | HR 68 | Temp 98.2°F | Resp 16 | Ht 65.0 in | Wt 177.2 lb

## 2017-05-11 DIAGNOSIS — F329 Major depressive disorder, single episode, unspecified: Secondary | ICD-10-CM | POA: Diagnosis not present

## 2017-05-11 DIAGNOSIS — F419 Anxiety disorder, unspecified: Secondary | ICD-10-CM | POA: Diagnosis not present

## 2017-05-11 DIAGNOSIS — Z23 Encounter for immunization: Secondary | ICD-10-CM

## 2017-05-11 MED ORDER — CITALOPRAM HYDROBROMIDE 20 MG PO TABS: 20.0000 mg | ORAL_TABLET | Freq: Every day | ORAL | 3 refills | Status: DC

## 2017-05-11 NOTE — Progress Notes (Signed)
   Subjective:    Patient ID: Tamara Gates, female    DOB: 10-18-1975, 41 y.o.   MRN: [id number]  HPI Anxiety and depression- pt was started on Citalopram last visit and reports that sxs are much improved.  Notes that sxs worsen on the last week of her OCPs.  Pt reports her anxiety has lessened.  Continues to struggle w/ concentration at times.  Pt reports the 'cinder block on my chest is gone'.  'it feels like I can breathe again'.  Continues to have some days that she wishes she could stay in bed.   Review of Systems For ROS see HPI     Objective:   Physical Exam  Constitutional: She is oriented to person, place, and time. She appears well-developed and well-nourished. No distress.  HENT:  Head: Normocephalic and atraumatic.  Neurological: She is alert and oriented to person, place, and time.  Skin: Skin is warm and dry.  Psychiatric: She has a normal mood and affect. Her behavior is normal. Thought content normal.  Vitals reviewed.         Assessment & Plan:

## 2017-05-11 NOTE — Patient Instructions (Signed)
Follow up in February for your complete physical Increase the Citalopram to - 2 of what you have at home and 1 of the new prescription Call with any questions or concerns I'm SO glad that you're doing better!!!

## 2017-05-11 NOTE — Assessment & Plan Note (Signed)
Improved since starting low dose Citalopram but continues to struggle the last week of OCPs.  Will increase to  daily and monitor for continued improvement.  Pt expressed understanding and is in agreement w/ plan.

## 2017-05-24 ENCOUNTER — Other Ambulatory Visit: Payer: Self-pay | Admitting: General Practice

## 2017-05-24 MED ORDER — CITALOPRAM HYDROBROMIDE 20 MG PO TABS: 20.0000 mg | ORAL_TABLET | Freq: Every day | ORAL | 1 refills | Status: DC

## 2017-05-30 DIAGNOSIS — E039 Hypothyroidism, unspecified: Secondary | ICD-10-CM | POA: Diagnosis not present

## 2017-06-01 ENCOUNTER — Encounter: Payer: Self-pay | Admitting: Family Medicine

## 2017-06-01 ENCOUNTER — Other Ambulatory Visit: Payer: Self-pay | Admitting: General Practice

## 2017-06-01 MED ORDER — CITALOPRAM HYDROBROMIDE 20 MG PO TABS: 20.0000 mg | ORAL_TABLET | Freq: Every day | ORAL | 1 refills | Status: DC

## 2017-06-15 ENCOUNTER — Other Ambulatory Visit: Payer: Self-pay

## 2017-06-15 ENCOUNTER — Encounter: Payer: Self-pay | Admitting: Family Medicine

## 2017-06-15 ENCOUNTER — Ambulatory Visit (INDEPENDENT_AMBULATORY_CARE_PROVIDER_SITE_OTHER): Payer: BLUE CROSS/BLUE SHIELD | Admitting: Family Medicine

## 2017-06-15 VITALS — BP 122/82 | HR 76 | Resp 16 | Ht 65.0 in | Wt 174.5 lb

## 2017-06-15 DIAGNOSIS — M5416 Radiculopathy, lumbar region: Secondary | ICD-10-CM

## 2017-06-15 MED ORDER — PREDNISONE 10 MG PO TABS: ORAL_TABLET | ORAL | 0 refills | Status: DC

## 2017-06-15 MED ORDER — CYCLOBENZAPRINE HCL 10 MG PO TABS: 10.0000 mg | ORAL_TABLET | Freq: Three times a day (TID) | ORAL | 0 refills | Status: DC | PRN

## 2017-06-15 NOTE — Progress Notes (Signed)
   Subjective:    Patient ID: Tamara Gates, female    DOB: [birth year], 41 y.o.   MRN: [id number]  HPI Back pain- pain started on Monday after playing in a tennis tournament in [place] over the weekend.  Back started to tighten up mid-day on Monday.  Then R hip was tight and pain was radiating down R leg, foot was numb.  She has been taking Aleve w/ minimal improvement.  Pt is able to sit which was not possible earlier in the week.  Able to walk.     Review of Systems For ROS see HPI     Objective:   Physical Exam  Constitutional: She is oriented to person, place, and time. She appears well-developed and well-nourished. No distress.  HENT:  Head: Normocephalic and atraumatic.  Cardiovascular: Intact distal pulses.  Musculoskeletal: She exhibits tenderness (TTP over R lumbar paraspinal muscles, no TTP over lumbar spine).  Neurological: She is alert and oriented to person, place, and time. She has normal reflexes.  + SLR on R, negative on L  Skin: Skin is warm and dry. No rash noted. No erythema.  Psychiatric: She has a normal mood and affect. Her behavior is normal. Thought content normal.  Vitals reviewed.         Assessment & Plan:  Acute radicular LBP- new.  Pt's sxs are consistent w/ spasm and radiculopathy.  Start scheduled Prednisone taper, flexeril prn.  Reviewed supportive care and red flags that should prompt return.  Pt expressed understanding and is in agreement w/ plan.

## 2017-06-15 NOTE — Patient Instructions (Signed)
Follow up as needed or as scheduled START the Prednisone as directed- 3 tabs at the same time x3 days, and then 2 tabs at the same time x3 days, and then 1 tab daily.  Take w/ food Use the cyclobenzaprine at night and as needed during the day for spasm- may cause drowsiness Do some gentle stretching to avoid worsening stiffness Call with any questions or concerns Hang in there!

## 2017-06-19 ENCOUNTER — Ambulatory Visit: Payer: Self-pay | Admitting: *Deleted

## 2017-06-19 NOTE — Telephone Encounter (Signed)
Pt   Seen   4    Days   Ago    By   Dr  Beverely Lowabori   For  Sciatica      Placed   On  meds   Was getting  Better   Today  Lifted  Daughter  Pain  Worse   Not  releived  By  Tylenol   Spoke   With   Bethany Dillard    At  [place]  appt  Made   With  Dra ANDY  AT  1000     Reason for Disposition . [1] Pain radiates into the thigh or further down the leg AND [2] one leg  Answer Assessment - Initial Assessment Questions 1. ONSET: "When did the pain begin?"       X   1   Week   2. LOCATION: "Where does it hurt?" (upper, mid or lower back)      Low  Back  Pain   Pain  Radiates  Down  r  Leg      3. SEVERITY: "How bad is the pain?"  (e.g., Scale 1-10; mild, moderate, or severe)   - MILD (1-3): doesn't interfere with normal activities    - MODERATE (4-7): interferes with normal activities or awakens from sleep    - SEVERE (8-10): excruciating pain, unable to do any normal activities        8 4. PATTERN: "Is the pain constant?" (e.g., yes, no; constant, intermittent)       consistant 5. RADIATION: "Does the pain shoot into your legs or elsewhere?"      r  Leg    6. CAUSE:  "What do you think is causing the back pain?"        No  Known   Recent       7. BACK OVERUSE:  "Any recent lifting of heavy objects, strenuous work or exercise?"      No  Heavy  Lifting     Was getting  Better  Until  She  Lifted  Her  Child  Today  And  Pain  Went  Down r leg    8. MEDICATIONS: "What have you taken so far for the pain?" (e.g., nothing, acetaminophen, NSAIDS)    Prednisone   FLEXERIL   Tylenol   9. NEUROLOGIC SYMPTOMS: "Do you have any weakness, numbness, or problems with bowel/bladder control?"      R  Leg  Slightly  Some  Slight  Numbness   When  She  Bends  It  10. OTHER SYMPTOMS: "Do you have any other symptoms?" (e.g., fever, abdominal pain, burning with urination, blood in urine)       No 11. PREGNANCY: "Is there any chance you are pregnant?" (e.g., yes, no; LMP)      No  Chance   Taking  Oral   Birth   Control  Protocols used: BACK PAIN-A-AH

## 2017-06-19 NOTE — Telephone Encounter (Signed)
noted 

## 2017-06-20 ENCOUNTER — Ambulatory Visit: Payer: BLUE CROSS/BLUE SHIELD | Admitting: Family Medicine

## 2017-07-19 DIAGNOSIS — Z3041 Encounter for surveillance of contraceptive pills: Secondary | ICD-10-CM | POA: Diagnosis not present

## 2017-07-19 DIAGNOSIS — Z6829 Body mass index (BMI) 29.0-29.9, adult: Secondary | ICD-10-CM | POA: Diagnosis not present

## 2017-07-19 DIAGNOSIS — Z130 Encounter for screening for diseases of the blood and blood-forming organs and certain disorders involving the immune mechanism: Secondary | ICD-10-CM | POA: Diagnosis not present

## 2017-07-19 DIAGNOSIS — Z1231 Encounter for screening mammogram for malignant neoplasm of breast: Secondary | ICD-10-CM | POA: Diagnosis not present

## 2017-07-19 DIAGNOSIS — Z01419 Encounter for gynecological examination (general) (routine) without abnormal findings: Secondary | ICD-10-CM | POA: Diagnosis not present

## 2017-07-19 DIAGNOSIS — R319 Hematuria, unspecified: Secondary | ICD-10-CM | POA: Diagnosis not present

## 2017-07-19 DIAGNOSIS — Z1389 Encounter for screening for other disorder: Secondary | ICD-10-CM | POA: Diagnosis not present

## 2017-07-19 LAB — HM MAMMOGRAPHY

## 2017-07-27 ENCOUNTER — Other Ambulatory Visit: Payer: Self-pay | Admitting: Family Medicine

## 2017-07-27 DIAGNOSIS — N6489 Other specified disorders of breast: Secondary | ICD-10-CM

## 2017-07-28 ENCOUNTER — Encounter: Payer: Self-pay | Admitting: Emergency Medicine

## 2017-08-02 ENCOUNTER — Ambulatory Visit: Payer: BLUE CROSS/BLUE SHIELD

## 2017-08-02 ENCOUNTER — Ambulatory Visit
Admission: RE | Admit: 2017-08-02 | Discharge: 2017-08-02 | Disposition: A | Discharge status: Home / Self Care | Payer: BLUE CROSS/BLUE SHIELD | Source: Ambulatory Visit | Attending: Family Medicine | Admitting: Family Medicine

## 2017-08-02 DIAGNOSIS — N6489 Other specified disorders of breast: Secondary | ICD-10-CM

## 2017-08-02 DIAGNOSIS — R922 Inconclusive mammogram: Secondary | ICD-10-CM | POA: Diagnosis not present

## 2017-08-08 ENCOUNTER — Ambulatory Visit (HOSPITAL_COMMUNITY): Payer: BLUE CROSS/BLUE SHIELD

## 2017-08-08 ENCOUNTER — Encounter (HOSPITAL_COMMUNITY): Payer: Self-pay | Admitting: Family Medicine

## 2017-08-08 ENCOUNTER — Ambulatory Visit (HOSPITAL_COMMUNITY)
Admission: EM | Admit: 2017-08-08 | Discharge: 2017-08-08 | Disposition: A | Discharge status: Home / Self Care | Payer: BLUE CROSS/BLUE SHIELD | Attending: Internal Medicine | Admitting: Internal Medicine

## 2017-08-08 DIAGNOSIS — S0083XA Contusion of other part of head, initial encounter: Secondary | ICD-10-CM | POA: Diagnosis not present

## 2017-08-08 DIAGNOSIS — J3489 Other specified disorders of nose and nasal sinuses: Secondary | ICD-10-CM | POA: Diagnosis not present

## 2017-08-08 DIAGNOSIS — R51 Headache: Secondary | ICD-10-CM | POA: Diagnosis not present

## 2017-08-08 DIAGNOSIS — S060X0A Concussion without loss of consciousness, initial encounter: Secondary | ICD-10-CM

## 2017-08-08 DIAGNOSIS — R040 Epistaxis: Secondary | ICD-10-CM | POA: Diagnosis not present

## 2017-08-08 DIAGNOSIS — S0992XA Unspecified injury of nose, initial encounter: Secondary | ICD-10-CM | POA: Diagnosis not present

## 2017-08-08 DIAGNOSIS — W2100XA Struck by hit or thrown ball, unspecified type, initial encounter: Secondary | ICD-10-CM

## 2017-08-08 NOTE — Discharge Instructions (Signed)
Please apply ice to  your nose 20 minutes every hour for the next 24 hours.  You may alternate Tylenol and ibuprofen as needed for pain.  Please avoid driving, alcohol for 24 hours.  Please avoid bright lights such as TV, computers or monitors and cell phone until symptoms have resolved.  Return to the emergency department for any severe increase in headache, vomiting, worsening symptoms or urgent changes in your health.

## 2017-08-08 NOTE — ED Triage Notes (Signed)
Pt here for headache after being hit by a tennis ball this am. Reports she was hit in in the frontal area. sts nausea and disorientation. sts that she had bleeding from her nose.

## 2017-08-08 NOTE — ED Provider Notes (Signed)
[facility]    CSN: [id number] Arrival date & time: 08/08/17  1627     History   Chief Complaint Chief Complaint  Patient presents with  . Head Injury    HPI Tamara Gates is a 42 y.o. female presents to the urgent care facility for evaluation of nasal pain.  Patient states around 1030 this morning she was hit with a tennis ball while practicing tennis.  Ball made contact with her nose.  She did not lose consciousness.  She felt dazed.  Since the accident this morning she has had a slight headache with mild nausea with no vomiting.  She developed with mild epistasis to the left nare.  She denies any vomiting.  Nasal pain is 6 out of 10 without touch and 10 out of 10 to touch.  She is tender mostly along the bridge of the nose.  She denies any vision changes, pain with extraocular eye movement or limited range of motion.  She took Aleve earlier today with mild improvement of her pain.  HPI  Past Medical History:  Diagnosis Date  . Depression    post partum  . History of gestational diabetes   . Thyroid disease     Patient Active Problem List   Diagnosis Date Noted  . Anxiety and depression 04/07/2017  . Overweight 04/07/2017  . Hypothyroid 04/07/2017  . Hypoglycemia 04/07/2017  . Chronic low back pain 09/07/2016    Past Surgical History:  Procedure Laterality Date  . BREAST REDUCTION SURGERY    . MOUTH SURGERY      OB History    No data available       Home Medications    Prior to Admission medications   Medication Sig Start Date End Date Taking? Authorizing Provider  Cholecalciferol (VITAMIN D PO) Take by mouth.    [provider]  citalopram (CELEXA) 20 MG tablet Take 1 tablet (20 mg total) by mouth daily. 06/01/17   Sheliah Hatch, MD  cyclobenzaprine (FLEXERIL) 10 MG tablet Take 1 tablet (10 mg total) 3 (three) times daily as needed by mouth for muscle spasms. 06/15/17   Sheliah Hatch, MD  liothyronine (CYTOMEL) 5 MCG  tablet  08/09/16   [provider]  predniSONE (DELTASONE) 10 MG tablet 3 tabs x3 days and then 2 tabs x3 days and then 1 tab x3 days.  Take w/ food. 06/15/17   Sheliah Hatch, MD  SYNTHROID 88 MCG tablet  08/09/16   [provider]  TAYTULLA 1-20 MG-MCG(24) CAPS  08/09/16   [provider]    Family History Family History  Problem Relation Age of Onset  . Cancer Mother        Breast  . Osteoporosis Mother   . Hypertension Father   . Diabetes Father   . Hyperlipidemia Father   . Cancer Paternal Grandmother        colon  . Dementia Paternal Grandmother   . Cancer Paternal Grandfather        stomach    Social History Social History   Tobacco Use  . Smoking status: Never Smoker  . Smokeless tobacco: Never Used  Substance Use Topics  . Alcohol use: No  . Drug use: Not on file     Allergies   Mold extract [trichophyton] and Other   Review of Systems Review of Systems  Constitutional: Negative for fever.  HENT: Positive for nosebleeds (left nare earlier today right after the accident.  No bleeding since.Marland Kitchen  No problems breathing in and out of her nose.).   Eyes: Positive for photophobia. Negative for visual disturbance.  Respiratory: Negative for shortness of breath.   Cardiovascular: Negative for chest pain.  Gastrointestinal: Positive for nausea. Negative for abdominal pain and vomiting.  Genitourinary: Negative for difficulty urinating, dysuria and urgency.  Musculoskeletal: Negative for back pain and myalgias.  Skin: Negative for rash.  Neurological: Positive for headaches. Negative for dizziness, syncope, speech difficulty, light-headedness and numbness.  Psychiatric/Behavioral: Negative for confusion and decreased concentration. The patient is not nervous/anxious.      Physical Exam Triage Vital Signs ED Triage Vitals  Enc Vitals Group     BP 08/08/17 1648 127/78     Pulse Rate 08/08/17 1648 73     Resp 08/08/17 1648 18     Temp  08/08/17 1648 98.3 F (36.8 C)     Temp src --      SpO2 08/08/17 1648 100 %     Weight --      Height --      Head Circumference --      Peak Flow --      Pain Score 08/08/17 1647 6     Pain Loc --      Pain Edu? --      Excl. in GC? --    No data found.  Updated Vital Signs BP 127/78   Pulse 73   Temp 98.3 F (36.8 C)   Resp 18   SpO2 100%   Visual Acuity Right Eye Distance:   Left Eye Distance:   Bilateral Distance:    Right Eye Near:   Left Eye Near:    Bilateral Near:     Physical Exam  Constitutional: She is oriented to person, place, and time. She appears well-developed and well-nourished. No distress.  HENT:  Head: Normocephalic and atraumatic.  Right Ear: External ear normal.  Left Ear: External ear normal.  Mouth/Throat: Oropharynx is clear and moist.  Patient tender along the bridge of the nose with no swelling or ecchymosis noted.  Left nare shows mild dried blood with no active bleeding.  Posterior pharynx shows no active bleeding.  Right nare is normal.  No signs of septal hematoma.  No deformity noted to the nose.  Teeth are intact  Eyes: Conjunctivae and EOM are normal. Pupils are equal, round, and reactive to light.  No pain with extraocular eye movement.  Neck: Normal range of motion.  Cardiovascular: Normal rate and regular rhythm.  Pulmonary/Chest: Effort normal. No respiratory distress.  Musculoskeletal: Normal range of motion.  Lymphadenopathy:    She has no cervical adenopathy.  Neurological: She is alert and oriented to person, place, and time. She has normal strength. No cranial nerve deficit. She displays a negative Romberg sign. Coordination and gait normal. GCS eye subscore is 4. GCS verbal subscore is 5. GCS motor subscore is 6.  Reflex Scores:      Tricep reflexes are 4+ on the right side and 4+ on the left side.      Bicep reflexes are 4+ on the right side and 4+ on the left side. Skin: Skin is warm. No rash noted. No erythema.    Psychiatric: She has a normal mood and affect. Her behavior is normal. Judgment and thought content normal.     UC Treatments / Results  Labs (all labs ordered are listed, but only abnormal results are displayed) Labs Reviewed - No data to display  EKG  EKG Interpretation  None       Radiology Dg Nasal Bones  Result Date: 08/08/2017 CLINICAL DATA:  Pain along the nasal bones and forehead post being hit with a tennis ball. EXAM: NASAL BONES - 3+ VIEW COMPARISON:  None. FINDINGS: No evidence of displaced nasal bone fractures. The visualized paranasal sinuses are well aerated. IMPRESSION: Negative. Electronically Signed   By: Ted Mcalpineobrinka  Dimitrova M.D.   On: 08/08/2017 17:41    Procedures Procedures (including critical care time)  Medications Ordered in UC Medications - No data to display   Initial Impression / Assessment and Plan / UC Course  I have reviewed the triage vital signs and the nursing notes.  Pertinent labs & imaging results that were available during my care of the patient were reviewed by me and considered in my medical decision making (see chart for details).    42 year old female with facial contusion to the bridge of the nose with a tennis ball earlier today.  X-ray showed no nasal fracture.  She is nontender along the inferior orbital rims bilaterally.  No sign of muscle entrapment of the eyes, patient with full extraocular eye movement.  Patient did experience mild concussion symptoms, she is educated on concussion protocol as well as signs and symptoms return to the ED for.  She will follow-up with PCP in 1 week.   Final Clinical Impressions(s) / UC Diagnoses   Final diagnoses:  Contusion of face, initial encounter  Concussion without loss of consciousness, initial encounter    ED Discharge Orders    None        Evon SlackGaines, Thomas C, [place]-C 08/08/17 1808

## 2017-09-08 ENCOUNTER — Encounter: Payer: Self-pay | Admitting: Family Medicine

## 2017-09-08 ENCOUNTER — Ambulatory Visit (INDEPENDENT_AMBULATORY_CARE_PROVIDER_SITE_OTHER): Payer: BLUE CROSS/BLUE SHIELD | Admitting: Family Medicine

## 2017-09-08 ENCOUNTER — Other Ambulatory Visit: Payer: Self-pay

## 2017-09-08 VITALS — BP 120/80 | HR 76 | Temp 98.3°F | Resp 16 | Ht 65.0 in | Wt 179.1 lb

## 2017-09-08 DIAGNOSIS — Z0000 Encounter for general adult medical examination without abnormal findings: Secondary | ICD-10-CM | POA: Insufficient documentation

## 2017-09-08 DIAGNOSIS — Z23 Encounter for immunization: Secondary | ICD-10-CM

## 2017-09-08 DIAGNOSIS — E559 Vitamin D deficiency, unspecified: Secondary | ICD-10-CM | POA: Insufficient documentation

## 2017-09-08 DIAGNOSIS — E663 Overweight: Secondary | ICD-10-CM

## 2017-09-08 LAB — HEPATIC FUNCTION PANEL
ALBUMIN: 4 g/dL (ref 3.5–5.2)
ALK PHOS: 56 U/L (ref 39–117)
ALT: 20 U/L (ref 0–35)
AST: 17 U/L (ref 0–37)
Bilirubin, Direct: 0.1 mg/dL (ref 0.0–0.3)
Total Bilirubin: 0.4 mg/dL (ref 0.2–1.2)
Total Protein: 6.6 g/dL (ref 6.0–8.3)

## 2017-09-08 LAB — CBC WITH DIFFERENTIAL/PLATELET
BASOS ABS: 21 {cells}/uL (ref 0–200)
Basophils Relative: 0.3 %
EOS ABS: 131 {cells}/uL (ref 15–500)
Eosinophils Relative: 1.9 %
HEMATOCRIT: 38 % (ref 35.0–45.0)
HEMOGLOBIN: 13.1 g/dL (ref 11.7–15.5)
Lymphs Abs: 2167 cells/uL (ref 850–3900)
MCH: 28.9 pg (ref 27.0–33.0)
MCHC: 34.5 g/dL (ref 32.0–36.0)
MCV: 83.7 fL (ref 80.0–100.0)
MONOS PCT: 6 %
MPV: 11.1 fL (ref 7.5–12.5)
NEUTROS ABS: 4168 {cells}/uL (ref 1500–7800)
Neutrophils Relative %: 60.4 %
Platelets: 257 10*3/uL (ref 140–400)
RBC: 4.54 10*6/uL (ref 3.80–5.10)
RDW: 12.5 % (ref 11.0–15.0)
Total Lymphocyte: 31.4 %
WBC mixed population: 414 cells/uL (ref 200–950)
WBC: 6.9 10*3/uL (ref 3.8–10.8)

## 2017-09-08 LAB — BASIC METABOLIC PANEL
BUN: 12 mg/dL (ref 6–23)
CHLORIDE: 103 meq/L (ref 96–112)
CO2: 30 mEq/L (ref 19–32)
Calcium: 9.1 mg/dL (ref 8.4–10.5)
Creatinine, Ser: 0.84 mg/dL (ref 0.40–1.20)
GFR: 78.29 mL/min (ref >60.00)
Glucose, Bld: 96 mg/dL (ref 70–99)
POTASSIUM: 3.7 meq/L (ref 3.5–5.1)
Sodium: 138 mEq/L (ref 135–145)

## 2017-09-08 LAB — LIPID PANEL
CHOL/HDL RATIO: 5
CHOLESTEROL: 188 mg/dL (ref 0–200)
HDL: 41.7 mg/dL (ref >39.00)
LDL Cholesterol: 112 mg/dL — ABNORMAL HIGH (ref 0–99)
NonHDL: 146.06
TRIGLYCERIDES: 169 mg/dL — AB (ref 0.0–149.0)
VLDL: 33.8 mg/dL (ref 0.0–40.0)

## 2017-09-08 LAB — VITAMIN D 25 HYDROXY (VIT D DEFICIENCY, FRACTURES): VITD: 26.67 ng/mL — ABNORMAL LOW (ref 30.00–100.00)

## 2017-09-08 LAB — TSH: TSH: 0.65 u[IU]/mL (ref 0.35–4.50)

## 2017-09-08 NOTE — Addendum Note (Signed)
Addended by: Geannie RisenBRODMERKEL, Carlin Mamone L on: 09/08/2017 01:43 PM   Modules accepted: Orders

## 2017-09-08 NOTE — Assessment & Plan Note (Signed)
Check labs and replete prn. 

## 2017-09-08 NOTE — Assessment & Plan Note (Signed)
Ongoing issue.  Stressed need for healthy diet and regular exercise.  Check labs to risk stratify.  Refer to nutrition at pt's request.  Will follow.

## 2017-09-08 NOTE — Patient Instructions (Signed)
Follow up in 6 months to recheck weight loss progress We'll notify you of your lab results and make any changes if needed Continue to work on healthy diet and regular exercise- you can do it! We'll call you with your nutrition appt Call with any questions or concerns Happy Early Birthday!!!

## 2017-09-08 NOTE — Progress Notes (Signed)
   Subjective:    Patient ID: Tamara Gates, female    DOB: [birth year], 42 y.o.   MRN: [id number]  HPI CPE- UTD on pap, mammo, flu.  Due for Tdap.   Review of Systems Patient reports no vision/ hearing changes, adenopathy,fever, weight change,  persistant/recurrent hoarseness , swallowing issues, chest pain, palpitations, edema, persistant/recurrent cough, hemoptysis, dyspnea (rest/exertional/paroxysmal nocturnal), gastrointestinal bleeding (melena, rectal bleeding), abdominal pain, significant heartburn, bowel changes, GU symptoms (dysuria, hematuria, incontinence), Gyn symptoms (abnormal  bleeding, pain),  syncope, focal weakness, memory loss, numbness & tingling, skin/hair/nail changes, abnormal bruising or bleeding, anxiety, or depression.     Objective:   Physical Exam General Appearance:    Alert, cooperative, no distress, appears stated age  Head:    Normocephalic, without obvious abnormality, atraumatic  Eyes:    PERRL, conjunctiva/corneas clear, EOM's intact, fundi    benign, both eyes  Ears:    Normal TM's and external ear canals, both ears  Nose:   Nares normal, septum midline, mucosa normal, no drainage    or sinus tenderness  Throat:   Lips, mucosa, and tongue normal; teeth and gums normal  Neck:   Supple, symmetrical, trachea midline, no adenopathy;    Thyroid: no enlargement/tenderness/nodules  Back:     Symmetric, no curvature, ROM normal, no CVA tenderness  Lungs:     Clear to auscultation bilaterally, respirations unlabored  Chest Wall:    No tenderness or deformity   Heart:    Regular rate and rhythm, S1 and S2 normal, no murmur, rub   or gallop  Breast Exam:    Deferred to GYN  Abdomen:     Soft, non-tender, bowel sounds active all four quadrants,    no masses, no organomegaly  Genitalia:    Deferred to GYN  Rectal:    Extremities:   Extremities normal, atraumatic, no cyanosis or edema  Pulses:   2+ and symmetric all extremities  Skin:   Skin color, texture,  turgor normal, no rashes or lesions  Lymph nodes:   Cervical, supraclavicular, and axillary nodes normal  Neurologic:   CNII-XII intact, normal strength, sensation and reflexes    throughout          Assessment & Plan:

## 2017-09-08 NOTE — Assessment & Plan Note (Signed)
Pt's PE WNL w/ exception of being overweight.  UTD on pap, mammo.  Tdap given today.  Check labs.  Anticipatory guidance provided.

## 2017-09-08 NOTE — Addendum Note (Signed)
Addended by: Lenis DickinsonILLARD, BETHANY M on: 09/08/2017 01:47 PM   Modules accepted: Orders

## 2017-09-11 ENCOUNTER — Other Ambulatory Visit: Payer: Self-pay | Admitting: General Practice

## 2017-09-11 MED ORDER — VITAMIN D (ERGOCALCIFEROL) 1.25 MG (50000 UNIT) PO CAPS: 50000.0000 [IU] | ORAL_CAPSULE | ORAL | 0 refills | Status: DC

## 2017-09-28 ENCOUNTER — Encounter: Payer: BLUE CROSS/BLUE SHIELD | Attending: Family Medicine | Admitting: Skilled Nursing Facility1

## 2017-09-28 ENCOUNTER — Encounter: Payer: Self-pay | Admitting: Skilled Nursing Facility1

## 2017-09-28 DIAGNOSIS — E663 Overweight: Secondary | ICD-10-CM

## 2017-09-28 DIAGNOSIS — Z713 Dietary counseling and surveillance: Secondary | ICD-10-CM | POA: Insufficient documentation

## 2017-09-28 NOTE — Progress Notes (Signed)
  Assessment:  Primary concerns today: overweight.  Pt states after giving birth 12 years ago and then thyroid disease caused her to gain weight. Pt states she had GDM with her first pregnancy. Pt states she loves chocolate. Pt states her oldest daughter plays tennis so they eat out a lot. Pt states her husband is overweight. Pt states [place] and loves rice.   MEDICATIONS: See List   DIETARY INTAKE:  Usual eating pattern includes 3 meals and 2 snacks per day.  Everyday foods include chocolate.  Avoided foods include none stated.    24-hr recall:  B ( AM): 1-2 eggs with 1 toast or ham and cheese sandwich coffee with coconut creamer and sugar substitute  Snk ( AM):  L ( PM): rice, beans, chicken (leftovers) Snk ( PM): greek yogurt with honey and granola or plantain chips D ( PM): rice beans and chicken or plainatins and pork chops or cubed steak with rice and potatoes  Snk ( PM): 2 little dark chocolate squares  Beverages: coffee, water, soda, 1 glass of wine  Usual physical activity: play tennis 1.5 hours 1 time and in a week will play 4 days a week   Estimated energy needs: 1400 calories 158 g carbohydrates 105 g protein 39 g fat  Progress Towards Goal(s):  In progress.    Intervention:  Nutrition counseling. Dietitian educated the pt on weight management.  Goals: -Add non starchy vegetables to every lunch and dinner -control your portion sizes  -be the size you find comfortable and is healthy for your body Teaching Method Utilized: Visual Auditory Hands on  Handouts given during visit include: -Meal ideas  Barriers to learning/adherence to lifestyle change: none identified   Demonstrated degree of understanding via:  Teach Back   Monitoring/Evaluation:  Dietary intake, exercise, and body weight prn.

## 2017-11-26 ENCOUNTER — Other Ambulatory Visit: Payer: Self-pay | Admitting: Family Medicine

## 2017-12-05 ENCOUNTER — Other Ambulatory Visit: Payer: Self-pay | Admitting: General Practice

## 2017-12-05 MED ORDER — VITAMIN D (ERGOCALCIFEROL) 1.25 MG (50000 UNIT) PO CAPS: 50000.0000 [IU] | ORAL_CAPSULE | ORAL | 0 refills | Status: DC

## 2017-12-06 ENCOUNTER — Encounter: Payer: Self-pay | Admitting: Family Medicine

## 2017-12-06 ENCOUNTER — Other Ambulatory Visit: Payer: Self-pay | Admitting: Family Medicine

## 2017-12-06 ENCOUNTER — Ambulatory Visit (INDEPENDENT_AMBULATORY_CARE_PROVIDER_SITE_OTHER): Payer: BLUE CROSS/BLUE SHIELD | Admitting: Family Medicine

## 2017-12-06 VITALS — BP 100/76 | Ht 65.0 in | Wt 170.0 lb

## 2017-12-06 DIAGNOSIS — M7711 Lateral epicondylitis, right elbow: Secondary | ICD-10-CM

## 2017-12-06 MED ORDER — MELOXICAM 15 MG PO TABS: 15.0000 mg | ORAL_TABLET | Freq: Every day | ORAL | 1 refills | Status: DC

## 2017-12-06 NOTE — Progress Notes (Addendum)
Chief complaint: Right elbow pain x3 weeks  History of present illness: Tamara Gates is a 42 year old right-hand-dominant female who presents to sports medicine office today with chief complaint of right elbow pain.  She reports the symptoms have been present for approximately 3 weeks now.  She does not report of any specific inciting incident, trauma, or injury that could cause the pain.  She points to the lateral aspect of her right elbow where she feels the pain.  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  She also notices pain with extending her right arm out.  She does not report of any numbness, tingling, or burning paresthesias.  She describes the pain as a throbbing, aching, and nonradiating 3/10 pain.  She has iced it, use a friend's body helix elbow sleeve, as well as occasional ibuprofen.  She reports that the elbow sleeve was too tight.  She did not notice any difference with ibuprofen.  She reports that symptoms have been unchanged for the last 3 weeks.  She does not report of any previous elbow injury or trauma. No symptoms waking her up from sleep at nighttime.  She does not report of any wrist pain, hand pain, or finger pain.  She does not report feeling any weakness in her right arm.  She does not report of any fevers, chills, night sweats.  Review of systems:  As stated above  Her past medical history, surgical history, family history, and social history obtained and reviewed.  Her past medical history is notable for anxiety, depression, thyroid disease, gestational diabetes, and vitamin D deficiency; surgical history notable for mouth surgery and breast reduction surgery; she does not report of any current tobacco use; family history notable for osteoporosis, hypertension, hyperlipidemia, diabetes, colon cancer, and breast cancer; allergies and medications are reviewed and are reflected in EMR.  Physical exam: Vital signs are reviewed  and are documented in the chart Gen.: Alert, oriented, appears stated age, in no apparent distress HEENT: Moist oral mucosa Respiratory: Normal respirations, able to speak in full sentences Cardiac: Regular rate, distal pulses 2+ Integumentary: No rashes on visible skin:  Neurologic: Strength 5/5 with right elbow flexion, extension, pronation, supination, strength is also 5/5 with right wrist extension and flexion, she does have intact grip strength on the right side, sensation 2+ in bilateral upper extremities Psych: Normal affect, mood is described as good Musculoskeletal: Inspection of right elbow reveals no obvious deformity or muscle atrophy, no warmth, erythema, ecchymosis, or effusion, she is tender palpation over the right lateral epicondyle as well as about half a centimeter distally along the common extensor tendons, she does have pain with passive elbow flexion, resisted elbow extension, as well as supination, she also has pain with resisted right wrist extension as well as right third finger extension, she has full elbow range of motion, valgus stress testing negative, no tenderness over the distal biceps tendon or medial epicondyle, no tenderness posteriorly along the triceps tendon and olecranon  Assessment and plan: 1.  Right elbow pain, with clinical symptoms suggestive of right lateral epicondylitis  Plan: Discussed with Tamara Gates today that her symptoms are suggestive of right lateral epicondylitis, as she is specifically point tender over the lateral epicondylar region as well as the common extensor tendons.  She does have classic physical exam findings that are suggestive of this.  Discussed the body helix sleeve that she tried may have been too tight for her.  Discussed will have  her fitted with a countertraction brace. Will have her start on meloxicam 15 mg daily for pain over the next 10-14 days, then daily as needed for pain.  Discussed use of cryotherapy. Discussed HEP working  on flexibility and strength. If she is not improved in the next 2 to 3 weeks, next consideration would be a cortisone injection to the common extensor tendons.  She reports over the last few days symptoms are slightly improved, so hopefully we do not have to get at this point, but this would be the next step.  She will otherwise follow-up sooner as needed.     Haynes Kerns, MD Primary Care Sports Medicine Fellow [facility]

## 2017-12-27 ENCOUNTER — Ambulatory Visit: Payer: BLUE CROSS/BLUE SHIELD | Admitting: Family Medicine

## 2017-12-28 ENCOUNTER — Ambulatory Visit: Payer: BLUE CROSS/BLUE SHIELD | Admitting: Skilled Nursing Facility1

## 2018-01-03 ENCOUNTER — Ambulatory Visit (INDEPENDENT_AMBULATORY_CARE_PROVIDER_SITE_OTHER): Payer: BLUE CROSS/BLUE SHIELD | Admitting: Family Medicine

## 2018-01-03 ENCOUNTER — Encounter: Payer: Self-pay | Admitting: Family Medicine

## 2018-01-03 VITALS — BP 110/76 | Ht 65.0 in | Wt 170.0 lb

## 2018-01-03 DIAGNOSIS — M7711 Lateral epicondylitis, right elbow: Secondary | ICD-10-CM | POA: Diagnosis not present

## 2018-01-03 NOTE — Progress Notes (Signed)
Chief complaint: Follow-up of right elbow pain x7 weeks  History of present illness: Tamara Gates is a 42 year old right-hand-dominant female who presents to the sports medicine office today for follow-up of right elbow pain.  She was here for initial presentation and evaluation of symptoms about a month ago back on 12/06/2017.  Symptoms were consistent with right lateral epicondylitis.  She is an avid Armed forces operational officertennis player.  She reports that symptoms are about 98% better today, reports that she has been a little bit more guarded during daily activities as well as playing tennis.  Specifically with serving she reports having more of a flexed elbow position.  She reports noticing some throbbing pain in her right upper arm over the biceps as well as brachioradialis and brachialis region with any elbow movement.  She does not report of any interval injury or trauma.  She does not report of any numbness, tingling, or burning paresthesias.  She reports that she has been inconsistent with home exercise program.  She has been taking meloxicam, but not on a daily basis.  She does notice interval improvement with this. She has been using the countertraction brace, reports that sometimes during tennis she does have to take it off just because she typically does not like to wear sleeves or anything of that nature while playing tennis.  While wearing it, she does report of improvement.  She otherwise does not report of any acute concerns today.  No weakness in wrist, hand, or finger movement.  Review of systems:  As stated above  Interval past medical history, surgical history, family history, and social history obtained and unchanged. Her past medical history is notable for anxiety, depression, thyroid disease, gestational diabetes, and vitamin D deficiency; surgical history notable for mouth surgery and breast reduction surgery; she does not report of any current tobacco use; family history notable for osteoporosis, hypertension,  hyperlipidemia, diabetes, colon cancer, and breast cancer; allergies and medications are reviewed and are reflected in EMR.   Physical exam: Vital signs are reviewed and are documented in the chart Gen.: Alert, oriented, appears stated age, in no apparent distress HEENT: Moist oral mucosa Respiratory: Normal respirations, able to speak in full sentences Cardiac: Regular rate, distal pulses 2+ Integumentary: No rashes on visible skin:  Neurologic: Strength 5/5 with right elbow flexion, extension, pronation, supination without any pain, strength is also intact with wrist flexion, wrist extension, as well as third finger extension, sensation 2+ in bilateral upper extremities Psych: Normal affect, mood is described as good Musculoskeletal: Inspection of her right elbow reveals no obvious deformity or muscle atrophy, no warmth, erythema, ecchymosis, or effusion, slight tenderness to deep palpation over the right lateral epicondyle, as well as on the proximal aspect of the common extensor tendons, slight achiness sensation with palpation over the distal biceps and brachialis musculature on the right side, she does have full range of motion with pronation, supination, flexion, extension of her elbow, as well as wrist flexion, wrist extension and third finger extension, grip strength intact, valgus stress testing negative  Limited musculoskeletal ultrasound was performed in the office today of her right elbow -Slight capsular distention noted in the right lateral epicondyle and joint, with hypoechoic changes seen in this region -Neovascularization seen in the overlying common extensor tendons proximally -Unremarkable distal biceps, brachioradialis, and brachialis musculature  Impression: Right lateral epicondylitis, with evidence of neovascularization seen  Ultrasound performed and interpreted by: Haynes Kernshristopher Lake, MD  Assessment and plan: 1.  Right elbow pain, with clinical symptoms and  ultrasound  consistent with right lateral epicondylitis, with symptom improvement today  Plan: Discussed with Tamara Gates today that it seems as though she is making interval gains with symptom improvement in regards to right lateral epicondylitis.  Discussed continue with home exercise program, countertraction brace, combination of cryotherapy and heat, as well as meloxicam to take only as needed for pain.  Discussed that with ultrasound there is evidence of neovascularization seen, indicating the tendon is trying to heal.  Her symptoms are not severe enough to where she warrants cortisone injection today.  Given evidence of neovascularization, I do not feel she needs nitroglycerin patch.  Discussed to continue to monitor symptoms and just being consistent with the brace and home exercise program.  Will have her return office in 3 weeks for reevaluation or sooner as needed.   Haynes Kerns, M.D. Primary Care Sports Medicine Fellow [facility]

## 2018-01-04 DIAGNOSIS — E039 Hypothyroidism, unspecified: Secondary | ICD-10-CM | POA: Diagnosis not present

## 2018-03-01 ENCOUNTER — Other Ambulatory Visit: Payer: Self-pay | Admitting: Family Medicine

## 2018-03-15 ENCOUNTER — Ambulatory Visit: Payer: BLUE CROSS/BLUE SHIELD | Admitting: Family Medicine

## 2018-03-15 ENCOUNTER — Other Ambulatory Visit: Payer: Self-pay | Admitting: Family Medicine

## 2018-03-20 ENCOUNTER — Ambulatory Visit (INDEPENDENT_AMBULATORY_CARE_PROVIDER_SITE_OTHER): Payer: BLUE CROSS/BLUE SHIELD | Admitting: Family Medicine

## 2018-03-20 ENCOUNTER — Encounter: Payer: Self-pay | Admitting: Family Medicine

## 2018-03-20 ENCOUNTER — Other Ambulatory Visit: Payer: Self-pay

## 2018-03-20 VITALS — BP 118/82 | HR 74 | Temp 98.5°F | Resp 17 | Ht 65.0 in | Wt 187.8 lb

## 2018-03-20 DIAGNOSIS — E162 Hypoglycemia, unspecified: Secondary | ICD-10-CM

## 2018-03-20 DIAGNOSIS — R635 Abnormal weight gain: Secondary | ICD-10-CM

## 2018-03-20 DIAGNOSIS — F419 Anxiety disorder, unspecified: Secondary | ICD-10-CM | POA: Diagnosis not present

## 2018-03-20 DIAGNOSIS — E039 Hypothyroidism, unspecified: Secondary | ICD-10-CM

## 2018-03-20 DIAGNOSIS — F329 Major depressive disorder, single episode, unspecified: Secondary | ICD-10-CM

## 2018-03-20 DIAGNOSIS — E559 Vitamin D deficiency, unspecified: Secondary | ICD-10-CM

## 2018-03-20 LAB — HEMOGLOBIN A1C: Hgb A1c MFr Bld: 6.1 % (ref 4.6–6.5)

## 2018-03-20 LAB — HEPATIC FUNCTION PANEL
ALT: 19 U/L (ref 0–35)
AST: 15 U/L (ref 0–37)
Albumin: 4.1 g/dL (ref 3.5–5.2)
Alkaline Phosphatase: 63 U/L (ref 39–117)
Bilirubin, Direct: 0.1 mg/dL (ref 0.0–0.3)
Total Bilirubin: 0.4 mg/dL (ref 0.2–1.2)
Total Protein: 6.8 g/dL (ref 6.0–8.3)

## 2018-03-20 LAB — BASIC METABOLIC PANEL
BUN: 13 mg/dL (ref 6–23)
CALCIUM: 9.7 mg/dL (ref 8.4–10.5)
CO2: 26 meq/L (ref 19–32)
CREATININE: 0.89 mg/dL (ref 0.40–1.20)
Chloride: 104 mEq/L (ref 96–112)
GFR: 73.06 mL/min (ref >60.00)
GLUCOSE: 98 mg/dL (ref 70–99)
Potassium: 4 mEq/L (ref 3.5–5.1)
Sodium: 137 mEq/L (ref 135–145)

## 2018-03-20 LAB — CBC WITH DIFFERENTIAL/PLATELET
BASOS ABS: 0 10*3/uL (ref 0.0–0.1)
Basophils Relative: 0.5 % (ref 0.0–3.0)
EOS ABS: 0.1 10*3/uL (ref 0.0–0.7)
Eosinophils Relative: 1.3 % (ref 0.0–5.0)
HEMATOCRIT: 40.9 % (ref 36.0–46.0)
Hemoglobin: 13.7 g/dL (ref 12.0–15.0)
LYMPHS PCT: 22.3 % (ref 12.0–46.0)
Lymphs Abs: 1.9 10*3/uL (ref 0.7–4.0)
MCHC: 33.5 g/dL (ref 30.0–36.0)
MCV: 86.4 fl (ref 78.0–100.0)
Monocytes Absolute: 0.4 10*3/uL (ref 0.1–1.0)
Monocytes Relative: 5.3 % (ref 3.0–12.0)
Neutro Abs: 5.9 10*3/uL (ref 1.4–7.7)
Neutrophils Relative %: 70.6 % (ref 43.0–77.0)
PLATELETS: 238 10*3/uL (ref 150.0–400.0)
RBC: 4.73 Mil/uL (ref 3.87–5.11)
RDW: 13.3 % (ref 11.5–15.5)
WBC: 8.3 10*3/uL (ref 4.0–10.5)

## 2018-03-20 LAB — TSH: TSH: 0.76 u[IU]/mL (ref 0.35–4.50)

## 2018-03-20 LAB — T3, FREE: T3, Free: 4 pg/mL (ref 2.3–4.2)

## 2018-03-20 LAB — T4, FREE: FREE T4: 0.77 ng/dL (ref 0.60–1.60)

## 2018-03-20 LAB — VITAMIN D 25 HYDROXY (VIT D DEFICIENCY, FRACTURES): VITD: 37.55 ng/mL (ref 30.00–100.00)

## 2018-03-20 MED ORDER — CITALOPRAM HYDROBROMIDE 40 MG PO TABS: 40.0000 mg | ORAL_TABLET | Freq: Every day | ORAL | 3 refills | Status: DC

## 2018-03-20 NOTE — Patient Instructions (Addendum)
Follow up in 1 month to recheck mood We'll notify you of your lab results and make any changes if needed Continue to work on healthy diet and remain active INCREASE the Citalopram to 40mg  daily- 2 of what you have at home and 1 of the new prescription Call with any questions or concerns Hang in there!!

## 2018-03-20 NOTE — Assessment & Plan Note (Signed)
Pt has hx of this.  Check labs and replete prn. 

## 2018-03-20 NOTE — Assessment & Plan Note (Signed)
Chronic problem.  Has been following w/ Dr Katrinka BlazingSmith but recently she has gained weight, low energy, increased depression.  Check labs to assess for possible medication change.  Will follow.

## 2018-03-20 NOTE — Assessment & Plan Note (Signed)
Deteriorated.  Pt is again having days where she feels that she must stay in bed due to sadness.  Also avoiding social interactions- even on good days.  Increase Celexa to 40mg  daily and monitor closely for improvement.  Pt expressed understanding and is in agreement w/ plan.

## 2018-03-20 NOTE — Assessment & Plan Note (Signed)
Pt has hx of 3 syncopal episodes due to low blood sugar.  She is again starting to feel dizzy.  Has hx of gestational diabetes.  Will check A1C in setting of 18 lb weight gain.  Stressed need for healthy diet, regular eating.

## 2018-03-20 NOTE — Progress Notes (Signed)
   Subjective:    Patient ID: Tamara Gates, female    DOB: 1975-10-25, 42 y.o.   MRN: [id number]  HPI Weight gain- pt has gained 18 lbs since last visit.  She went and saw Nutrition- was hoping for meal plan but felt information was 'very basic'.  Did not follow up due to cost.  Pt has hypothyroid- followed by Dr Katrinka BlazingSmith- last visit 3 months ago.  Plays tennis, was hiking last week- regularly active.  Mood- 'I have really really good days and then days that I'm really sad'.  Having difficulty w/ concentration.  'when something makes me sad, i'm REALLY down.  Like in bed.  Can't move'.  Pt is avoiding interactions- all the time.  'I have to push myself and it didn't used to be like that'.    Hypoglycemia- pt has passed out on 3 occasions over the last 30 yrs.  Recently has been having feelings of dizziness after eating.  Hx of gestational DM.  + recent weight gain.   Review of Systems For ROS see HPI     Objective:   Physical Exam  Constitutional: She is oriented to person, place, and time. She appears well-developed and well-nourished. No distress.  obese  HENT:  Head: Normocephalic and atraumatic.  Eyes: Pupils are equal, round, and reactive to light. Conjunctivae and EOM are normal.  Neck: Normal range of motion. Neck supple. No thyromegaly present.  Cardiovascular: Normal rate, regular rhythm, normal heart sounds and intact distal pulses.  No murmur heard. Pulmonary/Chest: Effort normal and breath sounds normal. No respiratory distress.  Abdominal: Soft. She exhibits no distension. There is no tenderness.  Musculoskeletal: She exhibits no edema.  Lymphadenopathy:    She has no cervical adenopathy.  Neurological: She is alert and oriented to person, place, and time.  Skin: Skin is warm and dry.  Psychiatric: She has a normal mood and affect. Her behavior is normal.  Vitals reviewed.         Assessment & Plan:

## 2018-04-11 ENCOUNTER — Other Ambulatory Visit: Payer: Self-pay | Admitting: Family Medicine

## 2018-04-11 NOTE — Telephone Encounter (Signed)
Last OV 03/20/18, Next OV 04/20/18  Last filled 03/20/18, # 30 with 3 refills

## 2018-04-20 ENCOUNTER — Other Ambulatory Visit: Payer: Self-pay

## 2018-04-20 ENCOUNTER — Encounter: Payer: Self-pay | Admitting: Family Medicine

## 2018-04-20 ENCOUNTER — Ambulatory Visit (INDEPENDENT_AMBULATORY_CARE_PROVIDER_SITE_OTHER): Payer: BLUE CROSS/BLUE SHIELD | Admitting: Family Medicine

## 2018-04-20 VITALS — BP 118/80 | HR 77 | Temp 98.0°F | Resp 16 | Ht 65.0 in | Wt 185.5 lb

## 2018-04-20 DIAGNOSIS — F32A Depression, unspecified: Secondary | ICD-10-CM

## 2018-04-20 DIAGNOSIS — F419 Anxiety disorder, unspecified: Secondary | ICD-10-CM | POA: Diagnosis not present

## 2018-04-20 DIAGNOSIS — F329 Major depressive disorder, single episode, unspecified: Secondary | ICD-10-CM | POA: Diagnosis not present

## 2018-04-20 NOTE — Progress Notes (Signed)
   Subjective:    Patient ID: Tamara Gates, female    DOB: 1975/09/26, 42 y.o.   MRN: [id number]  HPI Anxiety/depression- ongoing issue.  At last visit Celexa was increased to 40mg  daily.  Pt reports feeling much better w/ increasing the dose.  'it's like day and night'.  Pt is now motivated, doing lots of projects.  Now exhausted due to busy days rather than depression.  'i'm so happy!'.   Review of Systems For ROS see HPI     Objective:   Physical Exam  Constitutional: She is oriented to person, place, and time. She appears well-developed and well-nourished. No distress.  HENT:  Head: Normocephalic and atraumatic.  Neurological: She is alert and oriented to person, place, and time.  Skin: Skin is warm and dry.  Psychiatric: She has a normal mood and affect. Her behavior is normal. Thought content normal.  Vitals reviewed.         Assessment & Plan:

## 2018-04-20 NOTE — Assessment & Plan Note (Signed)
Much improved.  Pt is very pleased with the results and is now looking forward to things, has improved motivation.  No med changes at this time.  Will follow.

## 2018-04-20 NOTE — Patient Instructions (Signed)
Schedule your complete physical in Feb Continue the Citalopram once daily- you're doing great! Call with any questions or concerns Happy Fall!!!

## 2018-07-05 ENCOUNTER — Other Ambulatory Visit: Payer: Self-pay | Admitting: Emergency Medicine

## 2018-07-05 MED ORDER — CITALOPRAM HYDROBROMIDE 40 MG PO TABS: 40.0000 mg | ORAL_TABLET | Freq: Every day | ORAL | 0 refills | Status: DC

## 2018-07-26 DIAGNOSIS — Z01419 Encounter for gynecological examination (general) (routine) without abnormal findings: Secondary | ICD-10-CM | POA: Diagnosis not present

## 2018-07-26 DIAGNOSIS — Z124 Encounter for screening for malignant neoplasm of cervix: Secondary | ICD-10-CM | POA: Diagnosis not present

## 2018-07-26 DIAGNOSIS — Z130 Encounter for screening for diseases of the blood and blood-forming organs and certain disorders involving the immune mechanism: Secondary | ICD-10-CM | POA: Diagnosis not present

## 2018-07-26 DIAGNOSIS — Z6834 Body mass index (BMI) 34.0-34.9, adult: Secondary | ICD-10-CM | POA: Diagnosis not present

## 2018-07-26 DIAGNOSIS — Z1231 Encounter for screening mammogram for malignant neoplasm of breast: Secondary | ICD-10-CM | POA: Diagnosis not present

## 2018-07-26 DIAGNOSIS — Z1151 Encounter for screening for human papillomavirus (HPV): Secondary | ICD-10-CM | POA: Diagnosis not present

## 2018-07-26 DIAGNOSIS — Z1389 Encounter for screening for other disorder: Secondary | ICD-10-CM | POA: Diagnosis not present

## 2018-07-26 LAB — HM PAP SMEAR

## 2018-07-26 LAB — HM MAMMOGRAPHY: HM MAMMO: NORMAL (ref 0–4)

## 2018-07-31 ENCOUNTER — Encounter: Payer: Self-pay | Admitting: General Practice

## 2018-08-07 ENCOUNTER — Other Ambulatory Visit: Payer: Self-pay

## 2018-08-07 ENCOUNTER — Encounter: Payer: Self-pay | Admitting: Family Medicine

## 2018-08-07 ENCOUNTER — Ambulatory Visit (INDEPENDENT_AMBULATORY_CARE_PROVIDER_SITE_OTHER): Payer: BLUE CROSS/BLUE SHIELD | Admitting: Family Medicine

## 2018-08-07 VITALS — BP 122/74 | HR 102 | Temp 99.9°F | Resp 18 | Ht 65.0 in | Wt 186.6 lb

## 2018-08-07 DIAGNOSIS — B349 Viral infection, unspecified: Secondary | ICD-10-CM | POA: Diagnosis not present

## 2018-08-07 DIAGNOSIS — R6889 Other general symptoms and signs: Secondary | ICD-10-CM | POA: Diagnosis not present

## 2018-08-07 LAB — POCT INFLUENZA A/B
INFLUENZA A, POC: NEGATIVE
INFLUENZA B, POC: NEGATIVE

## 2018-08-07 MED ORDER — OSELTAMIVIR PHOSPHATE 75 MG PO CAPS: 75.0000 mg | ORAL_CAPSULE | Freq: Two times a day (BID) | ORAL | 0 refills | Status: DC

## 2018-08-07 MED ORDER — GUAIFENESIN-CODEINE 100-10 MG/5ML PO SOLN: 5.0000 mL | Freq: Four times a day (QID) | ORAL | 0 refills | Status: DC | PRN

## 2018-08-07 MED ORDER — BENZONATATE 100 MG PO CAPS: 100.0000 mg | ORAL_CAPSULE | Freq: Two times a day (BID) | ORAL | 0 refills | Status: DC | PRN

## 2018-08-07 NOTE — Patient Instructions (Signed)
Please follow up if symptoms do not improve or as needed.   You may take the tamiflu in case this is the flu  The other two medications will help your cough.  Advil and tylenol will help treat fevers and sore throat.  Rest and keep hydrated.  Call if fevers last longer than 5 days or if you develop shortness of breath.  Viral Illness, Adult Viruses are tiny germs that can get into a person's body and cause illness. There are many different types of viruses, and they cause many types of illness. Viral illnesses can range from mild to severe. They can affect various parts of the body. Common illnesses that are caused by a virus include colds and the flu. Viral illnesses also include serious conditions such as HIV/AIDS (human immunodeficiency virus/acquired immunodeficiency syndrome). A few viruses have been linked to certain cancers. What are the causes? Many types of viruses can cause illness. Viruses invade cells in your body, multiply, and cause the infected cells to malfunction or die. When the cell dies, it releases more of the virus. When this happens, you develop symptoms of the illness, and the virus continues to spread to other cells. If the virus takes over the function of the cell, it can cause the cell to divide and grow out of control, as is the case when a virus causes cancer. Different viruses get into the body in different ways. You can get a virus by:  Swallowing food or water that is contaminated with the virus.  Breathing in droplets that have been coughed or sneezed into the air by an infected person.  Touching a surface that has been contaminated with the virus and then touching your eyes, nose, or mouth.  Being bitten by an insect or animal that carries the virus.  Having sexual contact with a person who is infected with the virus.  Being exposed to blood or fluids that contain the virus, either through an open cut or during a transfusion. If a virus enters your body,  your body's defense system (immune system) will try to fight the virus. You may be at higher risk for a viral illness if your immune system is weak. What are the signs or symptoms? Symptoms vary depending on the type of virus and the location of the cells that it invades. Common symptoms of the main types of viral illnesses include: Cold and flu viruses  Fever.  Headache.  Sore throat.  Muscle aches.  Nasal congestion.  Cough. Digestive system (gastrointestinal) viruses  Fever.  Abdominal pain.  Nausea.  Diarrhea. Liver viruses (hepatitis)  Loss of appetite.  Tiredness.  Yellowing of the skin (jaundice). Brain and spinal cord viruses  Fever.  Headache.  Stiff neck.  Nausea and vomiting.  Confusion or sleepiness. Skin viruses  Warts.  Itching.  Rash. Sexually transmitted viruses  Discharge.  Swelling.  Redness.  Rash. How is this treated? Viruses can be difficult to treat because they live within cells. Antibiotic medicines do not treat viruses because these drugs do not get inside cells. Treatment for a viral illness may include:  Resting and drinking plenty of fluids.  Medicines to relieve symptoms. These can include over-the-counter medicine for pain and fever, medicines for cough or congestion, and medicines to relieve diarrhea.  Antiviral medicines. These drugs are available only for certain types of viruses. They may help reduce flu symptoms if taken early. There are also many antiviral medicines for hepatitis and HIV/AIDS. Some viral illnesses can be  prevented with vaccinations. A common example is the flu shot. Follow these instructions at home: Medicines   Take over-the-counter and prescription medicines only as told by your health care provider.  If you were prescribed an antiviral medicine, take it as told by your health care provider. Do not stop taking the medicine even if you start to feel better.  Be aware of when antibiotics  are needed and when they are not needed. Antibiotics do not treat viruses. If your health care provider thinks that you may have a bacterial infection as well as a viral infection, you may get an antibiotic. ? Do not ask for an antibiotic prescription if you have been diagnosed with a viral illness. That will not make your illness go away faster. ? Frequently taking antibiotics when they are not needed can lead to antibiotic resistance. When this develops, the medicine no longer works against the bacteria that it normally fights. General instructions  Drink enough fluids to keep your urine clear or pale yellow.  Rest as much as possible.  Return to your normal activities as told by your health care provider. Ask your health care provider what activities are safe for you.  Keep all follow-up visits as told by your health care provider. This is important. How is this prevented? Take these actions to reduce your risk of viral infection:  Eat a healthy diet and get enough rest.  Wash your hands often with soap and water. This is especially important when you are in public places. If soap and water are not available, use hand sanitizer.  Avoid close contact with friends and family who have a viral illness.  If you travel to areas where viral gastrointestinal infection is common, avoid drinking water or eating raw food.  Keep your immunizations up to date. Get a flu shot every year as told by your health care provider.  Do not share toothbrushes, nail clippers, razors, or needles with other people.  Always practice safe sex.  Contact a health care provider if:  You have symptoms of a viral illness that do not go away.  Your symptoms come back after going away.  Your symptoms get worse. Get help right away if:  You have trouble breathing.  You have a severe headache or a stiff neck.  You have severe vomiting or abdominal pain. This information is not intended to replace advice  given to you by your health care provider. Make sure you discuss any questions you have with your health care provider. Document Released: 11/27/2015 Document Revised: 12/30/2015 Document Reviewed: 11/27/2015 Elsevier Interactive Patient Education  2019 [employer].

## 2018-08-07 NOTE — Progress Notes (Signed)
Subjective   CC:  Chief Complaint  Patient presents with  . Flu like symptoms    Started yesterday and getting.Tamara Gates. Temp has been up and down, taking Tylenol    HPI: Tamara Gates is a 43 y.o. female who presents to the office today to address the problems listed above in the chief complaint.  Patient complains of flu like symptoms including myalgias, fever tmax 104 responsive to antipyretic ST,  Hacking dry cough and some congestion. Sxs have been present for 1-2 days. She has tried to alleviate the sxs with over-the-counter medicines with mild relief. No high risk factors for influenza complications or high risk household contacts are present. No SOB or CP are present. Taking in fluids adequately; decreased appetite bt no significant n/v/d. She has had the flu vaccine this season.  Was in [place] last week: + sick contacts.   Assessment  1. Acute viral syndrome      Plan   Neg flu test but classic sxs: treat empirically with tamiflu: add cough meds. Treat and monitor fevers. Return if not improving   Follow up: Return if symptoms worsen or fail to improve.   No orders of the defined types were placed in this encounter.  No orders of the defined types were placed in this encounter.    I reviewed the patients updated PMH, FH, and SocHx.    Patient Active Problem List   Diagnosis Date Noted  . Vitamin D deficiency 09/08/2017  . Physical exam 09/08/2017  . Anxiety and depression 04/07/2017  . Overweight 04/07/2017  . Hypothyroid 04/07/2017  . Hypoglycemia 04/07/2017  . Chronic low back pain 09/07/2016   Current Meds  Medication Sig  . Cholecalciferol (VITAMIN D PO) Take by mouth.  . citalopram (CELEXA) 40 MG tablet Take 1 tablet (40 mg total) by mouth daily.  . fluticasone (FLONASE) 50 MCG/ACT nasal spray fluticasone propionate 50 mcg/actuation nasal spray,suspension  . liothyronine (CYTOMEL) 5 MCG tablet   . Loteprednol Etabonate (LOTEMAX) 0.5 % GEL Lotemax 0.5 % eye  gel drops  . Propylene Glycol (SYSTANE COMPLETE) 0.6 % SOLN Apply to eye as needed.  Marland Kitchen. SYNTHROID 88 MCG tablet   . TAYTULLA 1-20 MG-MCG(24) CAPS    Family History: Patient family history includes Cancer in her mother, paternal grandfather, and paternal grandmother; Dementia in her paternal grandmother; Diabetes in her father; Hyperlipidemia in her father; Hypertension in her father; Osteoporosis in her mother. Social History:  Patient  reports that she has never smoked. She has never used smokeless tobacco. She reports that she does not drink alcohol or use drugs.  Review of Systems: Constitutional: negative for fever or malaise Ophthalmic: negative for photophobia, double vision or loss of vision Cardiovascular: negative for chest pain, dyspnea on exertion, or new LE swelling Respiratory: negative for SOB or persistent cough Gastrointestinal: negative for abdominal pain, change in bowel habits or melena Genitourinary: negative for dysuria or gross hematuria Musculoskeletal: negative for new gait disturbance or muscular weakness Integumentary: negative for new or persistent rashes Neurological: negative for TIA or stroke symptoms Psychiatric: negative for SI or delusions Allergic/Immunologic: negative for hives  Objective  Vitals: BP 122/74   Pulse (!) 102   Temp 99.9 F (37.7 C) (Oral)   Resp 18   Ht 5\' 5"  (1.651 m)   Wt 186 lb 9.6 oz (84.6 kg)   SpO2 97%   BMI 31.05 kg/m  General: no acute respiratory distress , nontoxic Psych:  Alert and oriented, normal mood and  affect HEENT: Normocephalic, nasal congestion present, TMs w/o erythema, OP with erythema w/o exudate, no LAD, supple neck  Cardiovascular:  RRR without murmur or gallop. no peripheral edema Respiratory:  Good breath sounds bilaterally, CTAB with normal respiratory effort Gastrointestinal: soft, flat abdomen, normal active bowel sounds, no palpable masses, no hepatosplenomegaly, no appreciated hernias Skin:  Warm,  no rashes Neurologic:   Mental status is normal. normal gait No visits with results within 1 Day(s) from this visit.  Latest known visit with results is:  Abstract on 07/31/2018  Component Date Value Ref Range Status  . HM Pap smear 07/26/2018 completed   Final     Commons side effects, risks, benefits, and alternatives for medications and treatment plan prescribed today were discussed, and the patient expressed understanding of the given instructions. Patient is instructed to call or message via MyChart if he/she has any questions or concerns regarding our treatment plan. No barriers to understanding were identified. We discussed Red Flag symptoms and signs in detail. Patient expressed understanding regarding what to do in case of urgent or emergency type symptoms.   Medication list was reconciled, printed and provided to the patient in AVS. Patient instructions and summary information was reviewed with the patient as documented in the AVS. This note was prepared with assistance of Dragon voice recognition software. Occasional wrong-word or sound-a-like substitutions may have occurred due to the inherent limitations of voice recognition software

## 2018-08-14 ENCOUNTER — Encounter: Payer: Self-pay | Admitting: Family Medicine

## 2018-08-14 ENCOUNTER — Other Ambulatory Visit: Payer: Self-pay

## 2018-08-14 ENCOUNTER — Ambulatory Visit (INDEPENDENT_AMBULATORY_CARE_PROVIDER_SITE_OTHER): Payer: BLUE CROSS/BLUE SHIELD | Admitting: Family Medicine

## 2018-08-14 VITALS — BP 118/75 | HR 78 | Temp 98.5°F | Resp 17 | Ht 65.0 in | Wt 186.5 lb

## 2018-08-14 DIAGNOSIS — R05 Cough: Secondary | ICD-10-CM | POA: Diagnosis not present

## 2018-08-14 DIAGNOSIS — R059 Cough, unspecified: Secondary | ICD-10-CM

## 2018-08-14 MED ORDER — ALBUTEROL SULFATE (2.5 MG/3ML) 0.083% IN NEBU
2.5000 mg | INHALATION_SOLUTION | Freq: Once | RESPIRATORY_TRACT | Status: AC
  Administered 2018-08-14: 2.5 mg via RESPIRATORY_TRACT

## 2018-08-14 MED ORDER — ALBUTEROL SULFATE HFA 108 (90 BASE) MCG/ACT IN AERS
2.0000 | INHALATION_SPRAY | Freq: Four times a day (QID) | RESPIRATORY_TRACT | 2 refills | Status: DC | PRN
Start: 2018-08-14 — End: 2024-04-19

## 2018-08-14 NOTE — Progress Notes (Signed)
   Subjective:    Patient ID: Tamara Gates, female    DOB: [birth year], 43 y.o.   MRN: [id number]  HPI Cough- pt was seen 1/7 w/ flu like sxs.  Treated w/ Tamiflu and codeine cough syrup.  Pt reports overall feeling better but cough persists.  Pt reports cough was almost constant yesterday and she was unable to catch her breath.  Took prescription cough syrup, robitussin, honey, expired inhaler.  Nasal congestion is improving.   Review of Systems For ROS see HPI     Objective:   Physical Exam Vitals signs reviewed.  Constitutional:      General: She is not in acute distress.    Appearance: She is well-developed.  HENT:     Head: Normocephalic and atraumatic.     Right Ear: Tympanic membrane normal.     Left Ear: Tympanic membrane normal.     Nose: Mucosal edema and rhinorrhea present.     Right Sinus: No maxillary sinus tenderness or frontal sinus tenderness.     Left Sinus: No maxillary sinus tenderness or frontal sinus tenderness.     Mouth/Throat:     Pharynx: Posterior oropharyngeal erythema (w/ PND) present.  Eyes:     Conjunctiva/sclera: Conjunctivae normal.     Pupils: Pupils are equal, round, and reactive to light.  Neck:     Musculoskeletal: Normal range of motion and neck supple.  Cardiovascular:     Rate and Rhythm: Normal rate and regular rhythm.     Heart sounds: Normal heart sounds.  Pulmonary:     Effort: Pulmonary effort is normal. No respiratory distress.     Breath sounds: Normal breath sounds. No wheezing or rales.     Comments: Dry, spasmodic cough- improved s/p neb tx Lymphadenopathy:     Cervical: No cervical adenopathy.           Assessment & Plan:  Cough- new.  Appears to be post-viral cough as lungs are CTAB and PE WNL.  Improved s/p neb tx.  Start albuterol HFA prn.  Reviewed supportive care and red flags that should prompt return.  Pt expressed understanding and is in agreement w/ plan.

## 2018-08-14 NOTE — Patient Instructions (Signed)
Follow up as needed or as scheduled Your cough is likely airway inflammation from your recent illness Use the Albuterol inhaler- 2 puffs as needed- to improve cough and shortness of breath Ibuprofen will improve airway inflammation- you can take twice daily Call with any questions or concerns Hang in there!!

## 2018-08-21 DIAGNOSIS — E039 Hypothyroidism, unspecified: Secondary | ICD-10-CM | POA: Diagnosis not present

## 2018-09-19 ENCOUNTER — Other Ambulatory Visit: Payer: Self-pay

## 2018-09-19 ENCOUNTER — Encounter: Payer: Self-pay | Admitting: Family Medicine

## 2018-09-19 ENCOUNTER — Ambulatory Visit (INDEPENDENT_AMBULATORY_CARE_PROVIDER_SITE_OTHER): Payer: BLUE CROSS/BLUE SHIELD | Admitting: Family Medicine

## 2018-09-19 VITALS — BP 120/74 | HR 80 | Temp 98.1°F | Resp 16 | Ht 65.0 in | Wt 185.0 lb

## 2018-09-19 DIAGNOSIS — E559 Vitamin D deficiency, unspecified: Secondary | ICD-10-CM | POA: Diagnosis not present

## 2018-09-19 DIAGNOSIS — E039 Hypothyroidism, unspecified: Secondary | ICD-10-CM | POA: Diagnosis not present

## 2018-09-19 DIAGNOSIS — Z0000 Encounter for general adult medical examination without abnormal findings: Secondary | ICD-10-CM | POA: Diagnosis not present

## 2018-09-19 LAB — HEPATIC FUNCTION PANEL
ALT: 21 U/L (ref 0–35)
AST: 20 U/L (ref 0–37)
Albumin: 4 g/dL (ref 3.5–5.2)
Alkaline Phosphatase: 48 U/L (ref 39–117)
BILIRUBIN DIRECT: 0.1 mg/dL (ref 0.0–0.3)
BILIRUBIN TOTAL: 0.3 mg/dL (ref 0.2–1.2)
Total Protein: 6.5 g/dL (ref 6.0–8.3)

## 2018-09-19 LAB — CBC WITH DIFFERENTIAL/PLATELET
BASOS PCT: 0.4 % (ref 0.0–3.0)
Basophils Absolute: 0 10*3/uL (ref 0.0–0.1)
EOS ABS: 0 10*3/uL (ref 0.0–0.7)
EOS PCT: 0.4 % (ref 0.0–5.0)
HEMATOCRIT: 38.6 % (ref 36.0–46.0)
Hemoglobin: 13 g/dL (ref 12.0–15.0)
LYMPHS PCT: 12.9 % (ref 12.0–46.0)
Lymphs Abs: 1.3 10*3/uL (ref 0.7–4.0)
MCHC: 33.6 g/dL (ref 30.0–36.0)
MCV: 86.7 fl (ref 78.0–100.0)
Monocytes Absolute: 0.4 10*3/uL (ref 0.1–1.0)
Monocytes Relative: 4.4 % (ref 3.0–12.0)
NEUTROS ABS: 8.3 10*3/uL — AB (ref 1.4–7.7)
Neutrophils Relative %: 81.9 % — ABNORMAL HIGH (ref 43.0–77.0)
PLATELETS: 239 10*3/uL (ref 150.0–400.0)
RBC: 4.45 Mil/uL (ref 3.87–5.11)
RDW: 13.8 % (ref 11.5–15.5)
WBC: 10.2 10*3/uL (ref 4.0–10.5)

## 2018-09-19 LAB — BASIC METABOLIC PANEL
BUN: 19 mg/dL (ref 6–23)
CALCIUM: 9.1 mg/dL (ref 8.4–10.5)
CHLORIDE: 102 meq/L (ref 96–112)
CO2: 25 meq/L (ref 19–32)
CREATININE: 0.99 mg/dL (ref 0.40–1.20)
GFR: 60.65 mL/min (ref >60.00)
Glucose, Bld: 76 mg/dL (ref 70–99)
Potassium: 3.9 mEq/L (ref 3.5–5.1)
Sodium: 138 mEq/L (ref 135–145)

## 2018-09-19 LAB — LIPID PANEL
CHOLESTEROL: 154 mg/dL (ref 0–200)
HDL: 37.5 mg/dL — AB (ref >39.00)
LDL Cholesterol: 86 mg/dL (ref 0–99)
NonHDL: 116.59
Total CHOL/HDL Ratio: 4
Triglycerides: 151 mg/dL — ABNORMAL HIGH (ref 0.0–149.0)
VLDL: 30.2 mg/dL (ref 0.0–40.0)

## 2018-09-19 LAB — VITAMIN D 25 HYDROXY (VIT D DEFICIENCY, FRACTURES): VITD: 51.96 ng/mL (ref 30.00–100.00)

## 2018-09-19 LAB — TSH: TSH: 0.45 u[IU]/mL (ref 0.35–4.50)

## 2018-09-19 NOTE — Assessment & Plan Note (Signed)
Chronic problem.  Currently asymptomatic.  Check labs.  Adjust meds prn  

## 2018-09-19 NOTE — Patient Instructions (Signed)
Follow up in 1 year or as needed We'll notify you of your lab results and make any changes if needed Continue to work on healthy diet and regular exercise- you look great!! Call with any questions or concerns Happy [place] Birthday!!!

## 2018-09-19 NOTE — Assessment & Plan Note (Signed)
Check labs and replete prn. 

## 2018-09-19 NOTE — Assessment & Plan Note (Signed)
Pt's PE WNL w/ exception of weight.  UTD on GYN and immunizations.  Check labs.  Anticipatory guidance provided.

## 2018-09-19 NOTE — Progress Notes (Signed)
   Subjective:    Patient ID: Tamara Gates, female    DOB: [birth year], 43 y.o.   MRN: [id number]  HPI CPE- UTD on GYN, immunizations.  Pt is exercising 3x/week- kickboxing and tennis.  Is changing diet.   Review of Systems Patient reports no vision/ hearing changes, adenopathy,fever, weight change,  persistant/recurrent hoarseness , swallowing issues, chest pain, palpitations, edema, persistant/recurrent cough, hemoptysis, dyspnea (rest/exertional/paroxysmal nocturnal), gastrointestinal bleeding (melena, rectal bleeding), abdominal pain, significant heartburn, bowel changes, GU symptoms (dysuria, hematuria, incontinence), Gyn symptoms (abnormal  bleeding, pain),  syncope, focal weakness, memory loss, numbness & tingling, skin/hair/nail changes, abnormal bruising or bleeding, anxiety, or depression.     Objective:   Physical Exam General Appearance:    Alert, cooperative, no distress, appears stated age  Head:    Normocephalic, without obvious abnormality, atraumatic  Eyes:    PERRL, conjunctiva/corneas clear, EOM's intact, fundi    benign, both eyes  Ears:    Normal TM's and external ear canals, both ears  Nose:   Nares normal, septum midline, mucosa normal, no drainage    or sinus tenderness  Throat:   Lips, mucosa, and tongue normal; teeth and gums normal  Neck:   Supple, symmetrical, trachea midline, no adenopathy;    Thyroid: no enlargement/tenderness/nodules  Back:     Symmetric, no curvature, ROM normal, no CVA tenderness  Lungs:     Clear to auscultation bilaterally, respirations unlabored  Chest Wall:    No tenderness or deformity   Heart:    Regular rate and rhythm, S1 and S2 normal, no murmur, rub   or gallop  Breast Exam:    Deferred to GYN  Abdomen:     Soft, non-tender, bowel sounds active all four quadrants,    no masses, no organomegaly  Genitalia:    Deferred to GYN  Rectal:    Extremities:   Extremities normal, atraumatic, no cyanosis or edema  Pulses:   2+ and  symmetric all extremities  Skin:   Skin color, texture, turgor normal, no rashes or lesions  Lymph nodes:   Cervical, supraclavicular, and axillary nodes normal  Neurologic:   CNII-XII intact, normal strength, sensation and reflexes    throughout          Assessment & Plan:

## 2018-09-20 ENCOUNTER — Encounter: Payer: Self-pay | Admitting: General Practice

## 2018-09-26 ENCOUNTER — Other Ambulatory Visit: Payer: Self-pay | Admitting: Family Medicine

## 2018-10-12 ENCOUNTER — Encounter: Payer: Self-pay | Admitting: Family Medicine

## 2018-10-12 MED ORDER — FLUTICASONE PROPIONATE 50 MCG/ACT NA SUSP: NASAL | 1 refills | Status: DC

## 2018-10-15 ENCOUNTER — Other Ambulatory Visit: Payer: Self-pay | Admitting: General Practice

## 2018-10-15 MED ORDER — FLUTICASONE PROPIONATE 50 MCG/ACT NA SUSP: 1.0000 | Freq: Every day | NASAL | 1 refills | Status: DC

## 2018-10-19 DIAGNOSIS — L723 Sebaceous cyst: Secondary | ICD-10-CM | POA: Diagnosis not present

## 2018-11-06 ENCOUNTER — Other Ambulatory Visit: Payer: Self-pay | Admitting: Family Medicine

## 2018-11-21 ENCOUNTER — Other Ambulatory Visit: Payer: Self-pay | Admitting: Family Medicine

## 2018-12-20 ENCOUNTER — Other Ambulatory Visit: Payer: Self-pay | Admitting: Family Medicine

## 2019-01-17 DIAGNOSIS — M62830 Muscle spasm of back: Secondary | ICD-10-CM | POA: Diagnosis not present

## 2019-01-17 DIAGNOSIS — M9903 Segmental and somatic dysfunction of lumbar region: Secondary | ICD-10-CM | POA: Diagnosis not present

## 2019-01-17 DIAGNOSIS — M9905 Segmental and somatic dysfunction of pelvic region: Secondary | ICD-10-CM | POA: Diagnosis not present

## 2019-01-17 DIAGNOSIS — M9902 Segmental and somatic dysfunction of thoracic region: Secondary | ICD-10-CM | POA: Diagnosis not present

## 2019-02-26 DIAGNOSIS — M9902 Segmental and somatic dysfunction of thoracic region: Secondary | ICD-10-CM | POA: Diagnosis not present

## 2019-02-26 DIAGNOSIS — M62830 Muscle spasm of back: Secondary | ICD-10-CM | POA: Diagnosis not present

## 2019-02-26 DIAGNOSIS — M9905 Segmental and somatic dysfunction of pelvic region: Secondary | ICD-10-CM | POA: Diagnosis not present

## 2019-02-26 DIAGNOSIS — M9903 Segmental and somatic dysfunction of lumbar region: Secondary | ICD-10-CM | POA: Diagnosis not present

## 2019-03-05 DIAGNOSIS — M9905 Segmental and somatic dysfunction of pelvic region: Secondary | ICD-10-CM | POA: Diagnosis not present

## 2019-03-05 DIAGNOSIS — M9903 Segmental and somatic dysfunction of lumbar region: Secondary | ICD-10-CM | POA: Diagnosis not present

## 2019-03-05 DIAGNOSIS — M9902 Segmental and somatic dysfunction of thoracic region: Secondary | ICD-10-CM | POA: Diagnosis not present

## 2019-03-05 DIAGNOSIS — M62830 Muscle spasm of back: Secondary | ICD-10-CM | POA: Diagnosis not present

## 2019-03-08 DIAGNOSIS — M9905 Segmental and somatic dysfunction of pelvic region: Secondary | ICD-10-CM | POA: Diagnosis not present

## 2019-03-08 DIAGNOSIS — M9903 Segmental and somatic dysfunction of lumbar region: Secondary | ICD-10-CM | POA: Diagnosis not present

## 2019-03-08 DIAGNOSIS — M9902 Segmental and somatic dysfunction of thoracic region: Secondary | ICD-10-CM | POA: Diagnosis not present

## 2019-03-08 DIAGNOSIS — M62830 Muscle spasm of back: Secondary | ICD-10-CM | POA: Diagnosis not present

## 2019-03-31 ENCOUNTER — Other Ambulatory Visit: Payer: Self-pay | Admitting: Family Medicine

## 2019-04-16 DIAGNOSIS — Z20828 Contact with and (suspected) exposure to other viral communicable diseases: Secondary | ICD-10-CM | POA: Diagnosis not present

## 2019-04-19 ENCOUNTER — Other Ambulatory Visit: Payer: Self-pay

## 2019-04-19 ENCOUNTER — Ambulatory Visit (INDEPENDENT_AMBULATORY_CARE_PROVIDER_SITE_OTHER): Payer: BC Managed Care – PPO | Admitting: Family Medicine

## 2019-04-19 ENCOUNTER — Encounter: Payer: Self-pay | Admitting: Family Medicine

## 2019-04-19 VITALS — HR 76 | Ht 64.5 in | Wt 183.0 lb

## 2019-04-19 DIAGNOSIS — F419 Anxiety disorder, unspecified: Secondary | ICD-10-CM | POA: Diagnosis not present

## 2019-04-19 DIAGNOSIS — F329 Major depressive disorder, single episode, unspecified: Secondary | ICD-10-CM | POA: Diagnosis not present

## 2019-04-19 MED ORDER — BUPROPION HCL ER (XL) 150 MG PO TB24: 150.0000 mg | ORAL_TABLET | Freq: Every day | ORAL | 3 refills | Status: DC

## 2019-04-19 NOTE — Progress Notes (Signed)
   Virtual Visit via Video   I connected with patient on 04/19/19 at  4:15 PM EDT by a video enabled telemedicine application and verified that I am speaking with the correct person using two identifiers.  Location patient: Home Location provider: [facility], Office Persons participating in the virtual visit: Patient, Provider, [place] (Jess B)  I discussed the limitations of evaluation and management by telemedicine and the availability of in person appointments. The patient expressed understanding and agreed to proceed.  Subjective:   HPI:   Depression- chronic problem, currently on Celexa 40mg  daily.  PHQ=12.  'it's just bad.  I want to cry all the time'.  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  Low energy.  Low motivation.  Has not been exercising or eating well. This frustrates and upsets her.  No libido.  Prior to March Celexa was 'fantastic'.  ROS:   See pertinent positives and negatives per HPI.  Patient Active Problem List   Diagnosis Date Noted  . Vitamin D deficiency 09/08/2017  . Physical exam 09/08/2017  . Anxiety and depression 04/07/2017  . Obesity (BMI 30.0-34.9) 04/07/2017  . Hypothyroid 04/07/2017  . Hypoglycemia 04/07/2017  . Chronic low back pain 09/07/2016    Social History   Tobacco Use  . Smoking status: Never Smoker  . Smokeless tobacco: Never Used  Substance Use Topics  . Alcohol use: No    Current Outpatient Medications:  .  albuterol (PROVENTIL HFA;VENTOLIN HFA) 108 (90 Base) MCG/ACT inhaler, Inhale 2 puffs into the lungs every 6 (six) hours as needed for wheezing or shortness of breath., Disp: 1 Inhaler, Rfl: 2 .  BLISOVI 24 FE 1-20 MG-MCG(24) tablet, Take 1 tablet by mouth daily., Disp: , Rfl:  .  Cholecalciferol (VITAMIN D PO), Take by mouth., Disp: , Rfl:  .  citalopram (CELEXA) 40 MG tablet, TAKE 1 TABLET BY MOUTH EVERY DAY, Disp: 90 tablet, Rfl: 0 .  fluticasone (FLONASE) 50  MCG/ACT nasal spray, PLACE 1 SPRAY INTO BOTH NOSTRILS DAILY., Disp: 16 g, Rfl: 3 .  liothyronine (CYTOMEL) 5 MCG tablet, , Disp: , Rfl:  .  Probiotic Product (PROBIOTIC ADVANCED PO), Take by mouth., Disp: , Rfl:  .  Propylene Glycol (SYSTANE COMPLETE) 0.6 % SOLN, Apply to eye as needed., Disp: , Rfl:  .  SYNTHROID 88 MCG tablet, , Disp: , Rfl:   Allergies  Allergen Reactions  . Mold Extract [Trichophyton] Other (See Comments)    Per allergy test  . Molds & Smuts   . Other Palpitations    Objective:   Pulse 76   Ht 5' 4.5" (1.638 m)   Wt 183 lb (83 kg)   BMI 30.93 kg/m   AAOx3, NAD NCAT, EOMI No obvious CN deficits Coloring WNL Pt is able to speak clearly, coherently without shortness of breath or increased work of breathing.  Thought process is linear.  Mood is appropriate.   Assessment and Plan:   Anxiety/depression- deteriorated.  Pt is frustrated, overtired, low energy, low motivation, and not taking care of herself.  Since Celexa previously worked well, I suspect this is situational due to [place] and [place].  Will add Wellbutrin in hopes of improving energy, motivation, metabolism, and even sex drive.  Will follow closely.  Pt expressed understanding and is in agreement w/ plan.    Annye Asa, MD 04/19/2019

## 2019-04-19 NOTE — Progress Notes (Signed)
I have discussed the procedure for the virtual visit with the patient who has given consent to proceed with assessment and treatment.   Eliora Nienhuis L Jeydi Klingel, CMA     

## 2019-04-23 ENCOUNTER — Telehealth: Payer: Self-pay | Admitting: *Deleted

## 2019-04-23 NOTE — Telephone Encounter (Signed)
Lm for patient to call to schedule appointment.   Depression follow-up 3-4 weeks (last appt was 04/19/2019).

## 2019-04-25 ENCOUNTER — Ambulatory Visit: Payer: BLUE CROSS/BLUE SHIELD | Admitting: Family Medicine

## 2019-04-26 DIAGNOSIS — N3946 Mixed incontinence: Secondary | ICD-10-CM | POA: Diagnosis not present

## 2019-04-26 DIAGNOSIS — R351 Nocturia: Secondary | ICD-10-CM | POA: Diagnosis not present

## 2019-05-08 DIAGNOSIS — E039 Hypothyroidism, unspecified: Secondary | ICD-10-CM | POA: Diagnosis not present

## 2019-05-11 ENCOUNTER — Other Ambulatory Visit: Payer: Self-pay | Admitting: Family Medicine

## 2019-05-22 ENCOUNTER — Ambulatory Visit (INDEPENDENT_AMBULATORY_CARE_PROVIDER_SITE_OTHER): Payer: BC Managed Care – PPO | Admitting: Family Medicine

## 2019-05-22 ENCOUNTER — Other Ambulatory Visit: Payer: Self-pay

## 2019-05-22 ENCOUNTER — Encounter: Payer: Self-pay | Admitting: Family Medicine

## 2019-05-22 VITALS — HR 80 | Ht 64.0 in | Wt 186.0 lb

## 2019-05-22 DIAGNOSIS — F329 Major depressive disorder, single episode, unspecified: Secondary | ICD-10-CM | POA: Diagnosis not present

## 2019-05-22 DIAGNOSIS — M6281 Muscle weakness (generalized): Secondary | ICD-10-CM | POA: Diagnosis not present

## 2019-05-22 DIAGNOSIS — M62838 Other muscle spasm: Secondary | ICD-10-CM | POA: Diagnosis not present

## 2019-05-22 DIAGNOSIS — F419 Anxiety disorder, unspecified: Secondary | ICD-10-CM | POA: Diagnosis not present

## 2019-05-22 DIAGNOSIS — N3946 Mixed incontinence: Secondary | ICD-10-CM | POA: Diagnosis not present

## 2019-05-22 DIAGNOSIS — R351 Nocturia: Secondary | ICD-10-CM | POA: Diagnosis not present

## 2019-05-22 DIAGNOSIS — F32A Depression, unspecified: Secondary | ICD-10-CM

## 2019-05-22 NOTE — Progress Notes (Signed)
I have discussed the procedure for the virtual visit with the patient who has given consent to proceed with assessment and treatment.   Derian Dimalanta L Elyssa Pendelton, CMA     

## 2019-05-22 NOTE — Progress Notes (Signed)
   Virtual Visit via Video   I connected with patient on 05/22/19 at  8:30 AM EDT by a video enabled telemedicine application and verified that I am speaking with the correct person using two identifiers.  Location patient: Home Location provider: Acup[facility]ist, Office Persons participating in the virtual visit: Patient, Provider, [place] (Jess B)  I discussed the limitations of evaluation and management by telemedicine and the availability of in person appointments. The patient expressed understanding and agreed to proceed.  Subjective:   HPI: Anxiety/Depression- pt has been struggling due to COVID.  At last visit, Wellbutrin was added to Celexa.  'i'm doing a lot better'.  Pt reports the 'pressure in my chest that made me want to burst out crying is no longer there'.  'i'm a lot calmer and I can focus on things'.  Pt feels she is in a good place w/ the current dosage.  'I feel like i'm grounded'.  Denies SOB, palpitations.  ROS:   See pertinent positives and negatives per HPI.  Patient Active Problem List   Diagnosis Date Noted  . Vitamin D deficiency 09/08/2017  . Physical exam 09/08/2017  . Anxiety and depression 04/07/2017  . Obesity (BMI 30.0-34.9) 04/07/2017  . Hypothyroid 04/07/2017  . Hypoglycemia 04/07/2017  . Chronic low back pain 09/07/2016    Social History   Tobacco Use  . Smoking status: Never Smoker  . Smokeless tobacco: Never Used  Substance Use Topics  . Alcohol use: No    Current Outpatient Medications:  .  albuterol (PROVENTIL HFA;VENTOLIN HFA) 108 (90 Base) MCG/ACT inhaler, Inhale 2 puffs into the lungs every 6 (six) hours as needed for wheezing or shortness of breath., Disp: 1 Inhaler, Rfl: 2 .  BLISOVI 24 FE 1-20 MG-MCG(24) tablet, Take 1 tablet by mouth daily., Disp: , Rfl:  .  buPROPion (WELLBUTRIN XL) 150 MG 24 hr tablet, TAKE 1 TABLET BY MOUTH EVERY DAY, Disp: 90 tablet, Rfl: 2 .  Cholecalciferol (VITAMIN D PO), Take by mouth., Disp: , Rfl:  .   citalopram (CELEXA) 40 MG tablet, TAKE 1 TABLET BY MOUTH EVERY DAY, Disp: 90 tablet, Rfl: 0 .  fluticasone (FLONASE) 50 MCG/ACT nasal spray, PLACE 1 SPRAY INTO BOTH NOSTRILS DAILY., Disp: 16 g, Rfl: 3 .  liothyronine (CYTOMEL) 5 MCG tablet, , Disp: , Rfl:  .  Probiotic Product (PROBIOTIC ADVANCED PO), Take by mouth., Disp: , Rfl:  .  Propylene Glycol (SYSTANE COMPLETE) 0.6 % SOLN, Apply to eye as needed., Disp: , Rfl:  .  SYNTHROID 88 MCG tablet, , Disp: , Rfl:   Allergies  Allergen Reactions  . Mold Extract [Trichophyton] Other (See Comments)    Per allergy test  . Molds & Smuts   . Other Palpitations    Objective:   Pulse 80   Ht 5\' 4"  (1.626 m)   Wt 186 lb (84.4 kg)   BMI 31.93 kg/m  AAOx3, NAD NCAT, EOMI No obvious CN deficits Coloring WNL Pt is able to speak clearly, coherently without shortness of breath or increased work of breathing.  Thought process is linear.  Mood is appropriate.   Assessment and Plan:   Anxiety/Depression- much improved since adding the Wellbutrin.  Pt feels current dose is appropriate and would like to continue.  No side effects from medication and is very pleased w/ her results.  Will continue to follow.   Annye Asa, MD 05/22/2019

## 2019-07-14 ENCOUNTER — Other Ambulatory Visit: Payer: Self-pay | Admitting: Family Medicine

## 2019-07-22 DIAGNOSIS — Z20828 Contact with and (suspected) exposure to other viral communicable diseases: Secondary | ICD-10-CM | POA: Diagnosis not present

## 2019-08-19 LAB — HM MAMMOGRAPHY: HM Mammogram: NORMAL (ref 0–4)

## 2019-08-22 ENCOUNTER — Other Ambulatory Visit: Payer: Self-pay | Admitting: Obstetrics and Gynecology

## 2019-08-22 DIAGNOSIS — R928 Other abnormal and inconclusive findings on diagnostic imaging of breast: Secondary | ICD-10-CM

## 2019-08-23 ENCOUNTER — Other Ambulatory Visit: Payer: Self-pay | Admitting: Family Medicine

## 2019-08-27 ENCOUNTER — Encounter: Payer: Self-pay | Admitting: General Practice

## 2019-08-30 ENCOUNTER — Other Ambulatory Visit: Payer: Self-pay | Admitting: Obstetrics and Gynecology

## 2019-08-30 ENCOUNTER — Ambulatory Visit
Admission: RE | Admit: 2019-08-30 | Discharge: 2019-08-30 | Disposition: A | Discharge status: Home / Self Care | Payer: BC Managed Care – PPO | Source: Ambulatory Visit | Attending: Obstetrics and Gynecology | Admitting: Obstetrics and Gynecology

## 2019-08-30 ENCOUNTER — Ambulatory Visit
Admission: RE | Admit: 2019-08-30 | Discharge: 2019-08-30 | Disposition: A | Discharge status: Home / Self Care | Payer: 59 | Source: Ambulatory Visit | Attending: Obstetrics and Gynecology | Admitting: Obstetrics and Gynecology

## 2019-08-30 ENCOUNTER — Other Ambulatory Visit: Payer: Self-pay

## 2019-08-30 DIAGNOSIS — R928 Other abnormal and inconclusive findings on diagnostic imaging of breast: Secondary | ICD-10-CM

## 2019-08-30 DIAGNOSIS — N6310 Unspecified lump in the right breast, unspecified quadrant: Secondary | ICD-10-CM

## 2019-09-02 ENCOUNTER — Other Ambulatory Visit: Payer: Self-pay

## 2019-09-02 ENCOUNTER — Other Ambulatory Visit: Payer: Self-pay | Admitting: Obstetrics and Gynecology

## 2019-09-02 ENCOUNTER — Ambulatory Visit
Admission: RE | Admit: 2019-09-02 | Discharge: 2019-09-02 | Disposition: A | Discharge status: Home / Self Care | Payer: 59 | Source: Ambulatory Visit | Attending: Obstetrics and Gynecology | Admitting: Obstetrics and Gynecology

## 2019-09-02 DIAGNOSIS — N6310 Unspecified lump in the right breast, unspecified quadrant: Secondary | ICD-10-CM

## 2019-09-06 ENCOUNTER — Other Ambulatory Visit: Payer: Self-pay | Admitting: Family Medicine

## 2019-09-06 ENCOUNTER — Other Ambulatory Visit: Payer: 59

## 2019-09-09 ENCOUNTER — Ambulatory Visit
Admission: RE | Admit: 2019-09-09 | Discharge: 2019-09-09 | Disposition: A | Discharge status: Home / Self Care | Payer: 59 | Source: Ambulatory Visit | Attending: Obstetrics and Gynecology | Admitting: Obstetrics and Gynecology

## 2019-09-09 ENCOUNTER — Other Ambulatory Visit: Payer: Self-pay

## 2019-09-09 DIAGNOSIS — N6310 Unspecified lump in the right breast, unspecified quadrant: Secondary | ICD-10-CM

## 2019-09-24 ENCOUNTER — Encounter: Payer: Self-pay | Admitting: Family Medicine

## 2019-09-24 ENCOUNTER — Other Ambulatory Visit: Payer: Self-pay

## 2019-09-24 ENCOUNTER — Ambulatory Visit (INDEPENDENT_AMBULATORY_CARE_PROVIDER_SITE_OTHER): Payer: 59 | Admitting: Family Medicine

## 2019-09-24 VITALS — BP 118/82 | HR 76 | Temp 97.6°F | Resp 16 | Ht 65.0 in | Wt 187.0 lb

## 2019-09-24 DIAGNOSIS — Z0000 Encounter for general adult medical examination without abnormal findings: Secondary | ICD-10-CM

## 2019-09-24 DIAGNOSIS — E669 Obesity, unspecified: Secondary | ICD-10-CM

## 2019-09-24 DIAGNOSIS — E559 Vitamin D deficiency, unspecified: Secondary | ICD-10-CM

## 2019-09-24 LAB — HEPATIC FUNCTION PANEL
ALT: 21 U/L (ref 0–35)
AST: 15 U/L (ref 0–37)
Albumin: 4.3 g/dL (ref 3.5–5.2)
Alkaline Phosphatase: 58 U/L (ref 39–117)
Bilirubin, Direct: 0.1 mg/dL (ref 0.0–0.3)
Total Bilirubin: 0.4 mg/dL (ref 0.2–1.2)
Total Protein: 6.7 g/dL (ref 6.0–8.3)

## 2019-09-24 LAB — CBC WITH DIFFERENTIAL/PLATELET
Basophils Absolute: 0 10*3/uL (ref 0.0–0.1)
Basophils Relative: 0.7 % (ref 0.0–3.0)
Eosinophils Absolute: 0.1 10*3/uL (ref 0.0–0.7)
Eosinophils Relative: 1.3 % (ref 0.0–5.0)
HCT: 39.8 % (ref 36.0–46.0)
Hemoglobin: 13.3 g/dL (ref 12.0–15.0)
Lymphocytes Relative: 25.7 % (ref 12.0–46.0)
Lymphs Abs: 1.6 10*3/uL (ref 0.7–4.0)
MCHC: 33.4 g/dL (ref 30.0–36.0)
MCV: 86.1 fl (ref 78.0–100.0)
Monocytes Absolute: 0.3 10*3/uL (ref 0.1–1.0)
Monocytes Relative: 5.1 % (ref 3.0–12.0)
Neutro Abs: 4.3 10*3/uL (ref 1.4–7.7)
Neutrophils Relative %: 67.2 % (ref 43.0–77.0)
Platelets: 241 10*3/uL (ref 150.0–400.0)
RBC: 4.62 Mil/uL (ref 3.87–5.11)
RDW: 13.5 % (ref 11.5–15.5)
WBC: 6.4 10*3/uL (ref 4.0–10.5)

## 2019-09-24 LAB — LIPID PANEL
Cholesterol: 205 mg/dL — ABNORMAL HIGH (ref 0–200)
HDL: 47.1 mg/dL (ref >39.00)
LDL Cholesterol: 129 mg/dL — ABNORMAL HIGH (ref 0–99)
NonHDL: 158.34
Total CHOL/HDL Ratio: 4
Triglycerides: 146 mg/dL (ref 0.0–149.0)
VLDL: 29.2 mg/dL (ref 0.0–40.0)

## 2019-09-24 LAB — TSH: TSH: 1.07 u[IU]/mL (ref 0.35–4.50)

## 2019-09-24 LAB — BASIC METABOLIC PANEL
BUN: 18 mg/dL (ref 6–23)
CO2: 26 mEq/L (ref 19–32)
Calcium: 9.3 mg/dL (ref 8.4–10.5)
Chloride: 104 mEq/L (ref 96–112)
Creatinine, Ser: 0.9 mg/dL (ref 0.40–1.20)
GFR: 67.4 mL/min (ref >60.00)
Glucose, Bld: 98 mg/dL (ref 70–99)
Potassium: 4 mEq/L (ref 3.5–5.1)
Sodium: 137 mEq/L (ref 135–145)

## 2019-09-24 LAB — VITAMIN D 25 HYDROXY (VIT D DEFICIENCY, FRACTURES): VITD: 43.19 ng/mL (ref 30.00–100.00)

## 2019-09-24 NOTE — Patient Instructions (Addendum)
Follow up in 1 year or as needed We'll notify you of your lab results and make any changes if needed Continue to work on healthy diet and regular exercise- you're doing great!!! Call with any questions or concerns Stay Safe!! Stay Healthy!!! 

## 2019-09-24 NOTE — Assessment & Plan Note (Signed)
Pt has hx of similar.  Check labs and replete prn. °

## 2019-09-24 NOTE — Assessment & Plan Note (Signed)
Ongoing issue for pt.  Plans to lose weight w/ diet and exercise.  Check labs to risk stratify.  Will follow.

## 2019-09-24 NOTE — Progress Notes (Signed)
   Subjective:    Patient ID: Tamara Gates, female    DOB: 01-Mar-1976, 44 y.o.   MRN: [id number]  HPI CPE- UTD on pap, mammo, immunizations.   Review of Systems Patient reports no vision/ hearing changes, adenopathy,fever, weight change,  persistant/recurrent hoarseness , swallowing issues, chest pain, palpitations, edema, persistant/recurrent cough, hemoptysis, dyspnea (rest/exertional/paroxysmal nocturnal), gastrointestinal bleeding (melena, rectal bleeding), abdominal pain, significant heartburn, bowel changes, GU symptoms (dysuria, hematuria, incontinence), Gyn symptoms (abnormal  bleeding, pain),  syncope, focal weakness, memory loss, numbness & tingling, skin/hair/nail changes, abnormal bruising or bleeding, anxiety, or depression.   This visit occurred during the SARS-CoV-2 public health emergency.  Safety protocols were in place, including screening questions prior to the visit, additional usage of staff PPE, and extensive cleaning of exam room while observing appropriate contact time as indicated for disinfecting solutions.       Objective:   Physical Exam General Appearance:    Alert, cooperative, no distress, appears stated age  Head:    Normocephalic, without obvious abnormality, atraumatic  Eyes:    PERRL, conjunctiva/corneas clear, EOM's intact, fundi    benign, both eyes  Ears:    Normal TM's and external ear canals, both ears  Nose:   Nares normal, septum midline, mucosa normal, no drainage    or sinus tenderness  Throat:   Lips, mucosa, and tongue normal; teeth and gums normal  Neck:   Supple, symmetrical, trachea midline, no adenopathy;    Thyroid: no enlargement/tenderness/nodules  Back:     Symmetric, no curvature, ROM normal, no CVA tenderness  Lungs:     Clear to auscultation bilaterally, respirations unlabored  Chest Wall:    No tenderness or deformity   Heart:    Regular rate and rhythm, S1 and S2 normal, no murmur, rub   or gallop  Breast Exam:    Deferred  to GYN  Abdomen:     Soft, non-tender, bowel sounds active all four quadrants,    no masses, no organomegaly  Genitalia:    Deferred to GYN  Rectal:    Extremities:   Extremities normal, atraumatic, no cyanosis or edema  Pulses:   2+ and symmetric all extremities  Skin:   Skin color, texture, turgor normal, no rashes or lesions  Lymph nodes:   Cervical, supraclavicular, and axillary nodes normal  Neurologic:   CNII-XII intact, normal strength, sensation and reflexes    throughout          Assessment & Plan:

## 2019-09-24 NOTE — Assessment & Plan Note (Signed)
Pt's PE WNL w/ exception of obesity.  UTD on gyn, immunizations.  Check labs.  Anticipatory guidance provided.  

## 2019-10-07 ENCOUNTER — Other Ambulatory Visit: Payer: Self-pay | Admitting: Family Medicine

## 2019-12-29 ENCOUNTER — Other Ambulatory Visit: Payer: Self-pay | Admitting: Family Medicine

## 2020-01-28 ENCOUNTER — Other Ambulatory Visit: Payer: Self-pay | Admitting: Family Medicine

## 2020-02-06 ENCOUNTER — Other Ambulatory Visit: Payer: Self-pay | Admitting: Family Medicine

## 2020-02-07 ENCOUNTER — Other Ambulatory Visit: Payer: Self-pay

## 2020-02-07 ENCOUNTER — Other Ambulatory Visit: Payer: Self-pay | Admitting: Family Medicine

## 2020-02-07 ENCOUNTER — Encounter: Payer: Self-pay | Admitting: Family Medicine

## 2020-02-07 MED ORDER — CITALOPRAM HYDROBROMIDE 40 MG PO TABS: 40.0000 mg | ORAL_TABLET | Freq: Every day | ORAL | 0 refills | Status: DC

## 2021-01-27 ENCOUNTER — Encounter: Payer: Self-pay | Admitting: *Deleted

## 2021-11-13 LAB — HM MAMMOGRAPHY

## 2021-12-04 IMAGING — MG MM BREAST BX W/ LOC DEV 1ST LESION IMAGE BX SPEC STEREO GUIDE*R*
8 of 19 series · 8 of 39 positions shown · non-contrast
Comparison: Previous exams.
COMPARISON: Previous exams.

Addendum:
CLINICAL DATA: Biopsy of right breast mass

EXAM:
RIGHT BREAST STEREOTACTIC CORE NEEDLE BIOPSY

[R (1 of 8)]
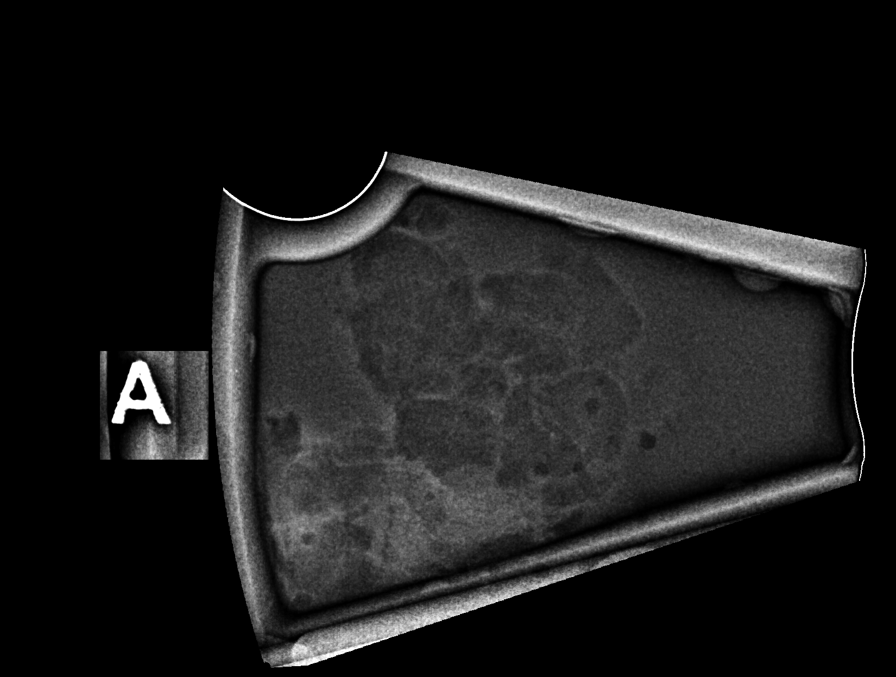

[R (2 of 8)]
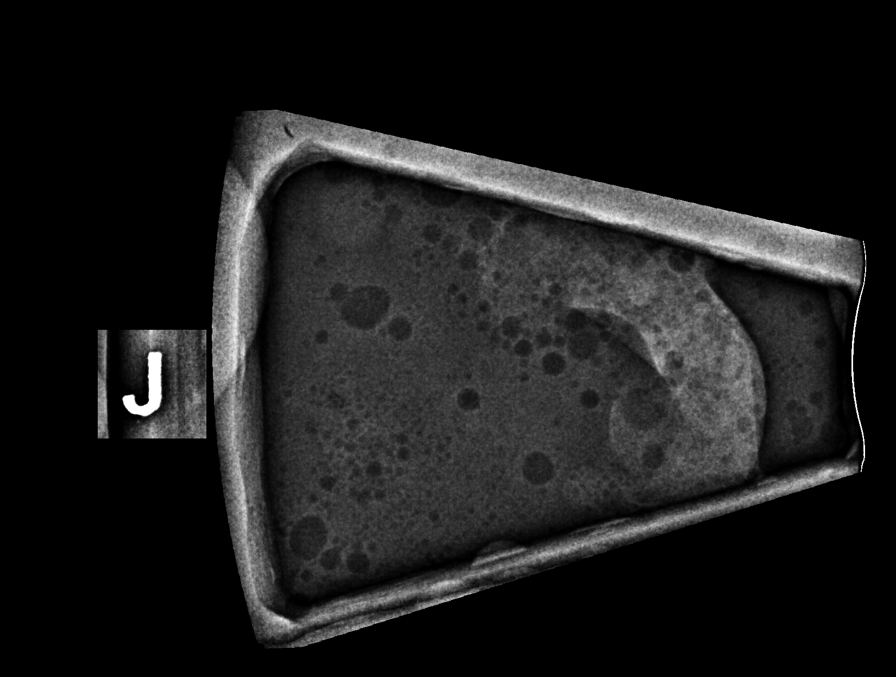

[R (3 of 8)]
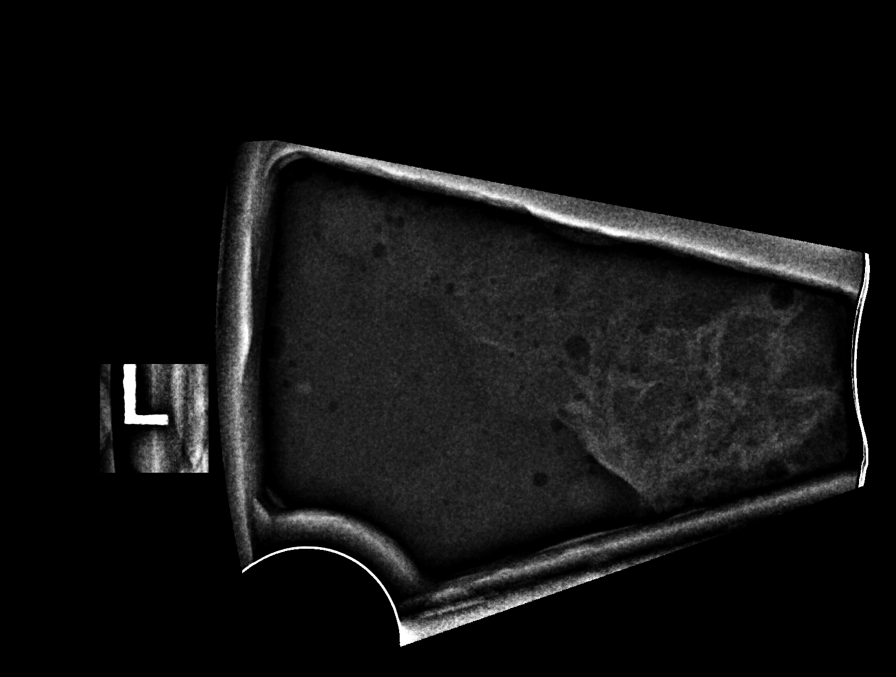

[R (4 of 8)]
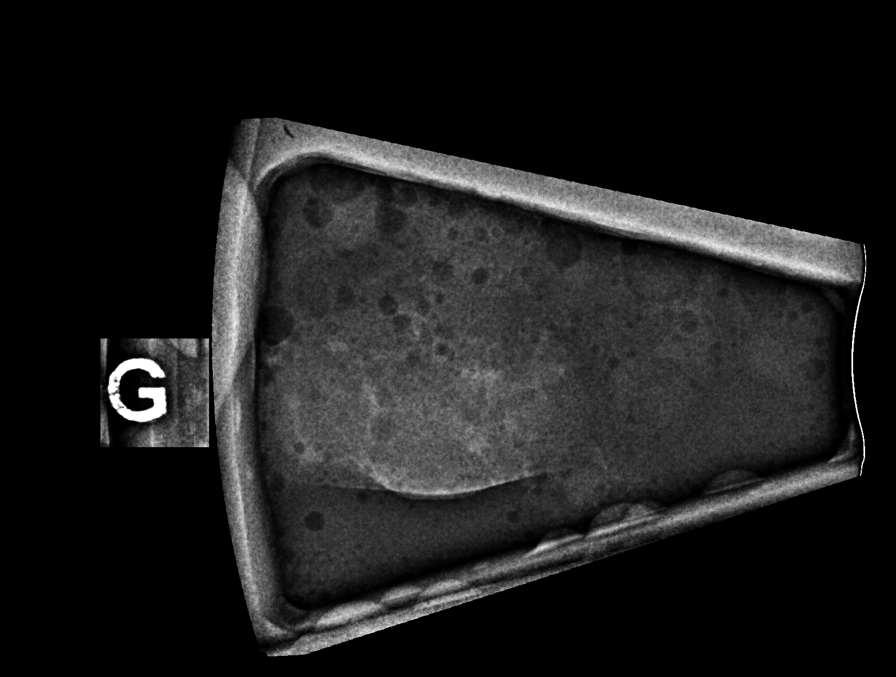

[R (5 of 8)]
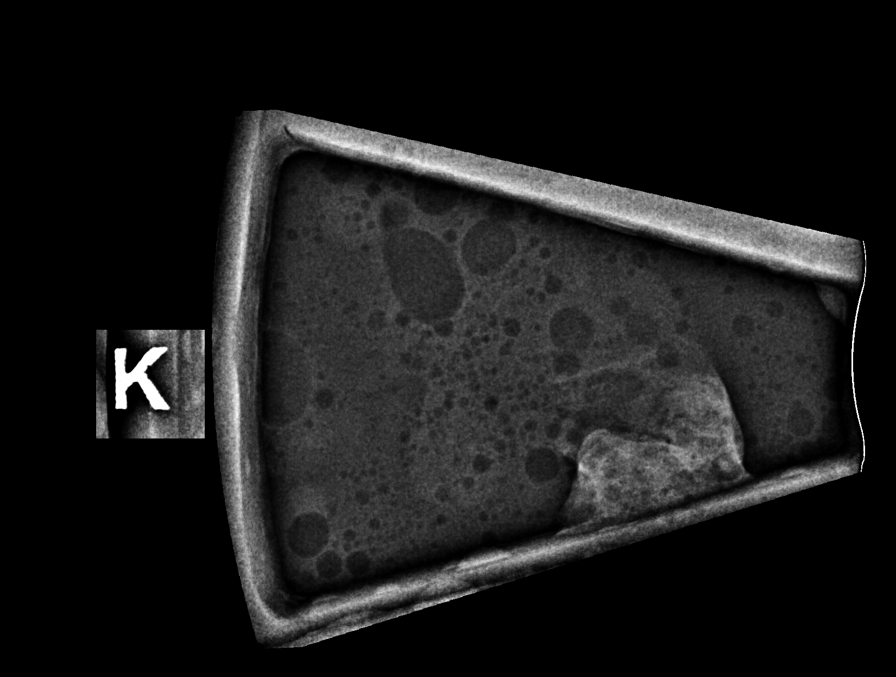

[R (6 of 8)]
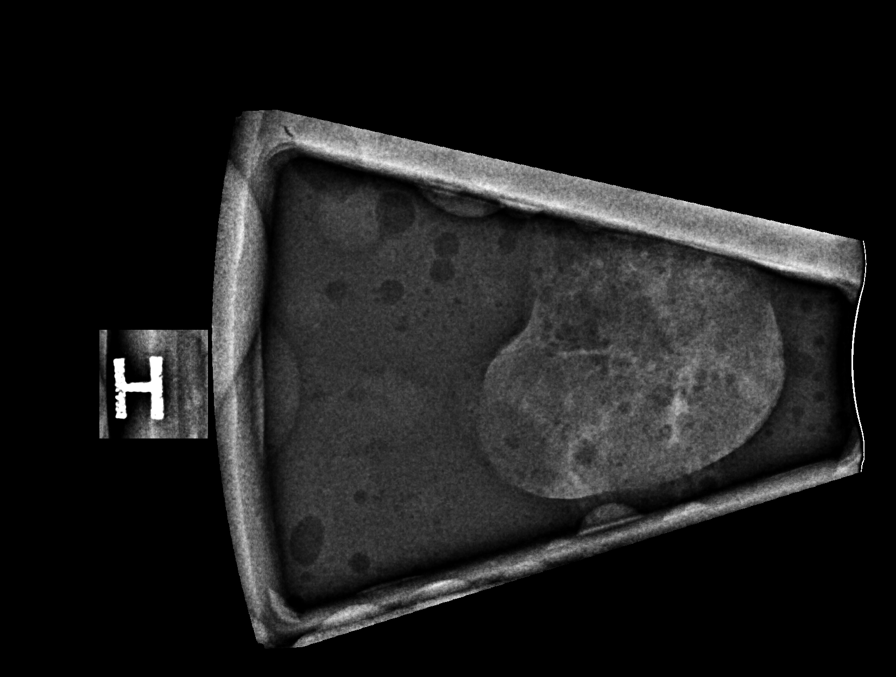

[R (7 of 8)]
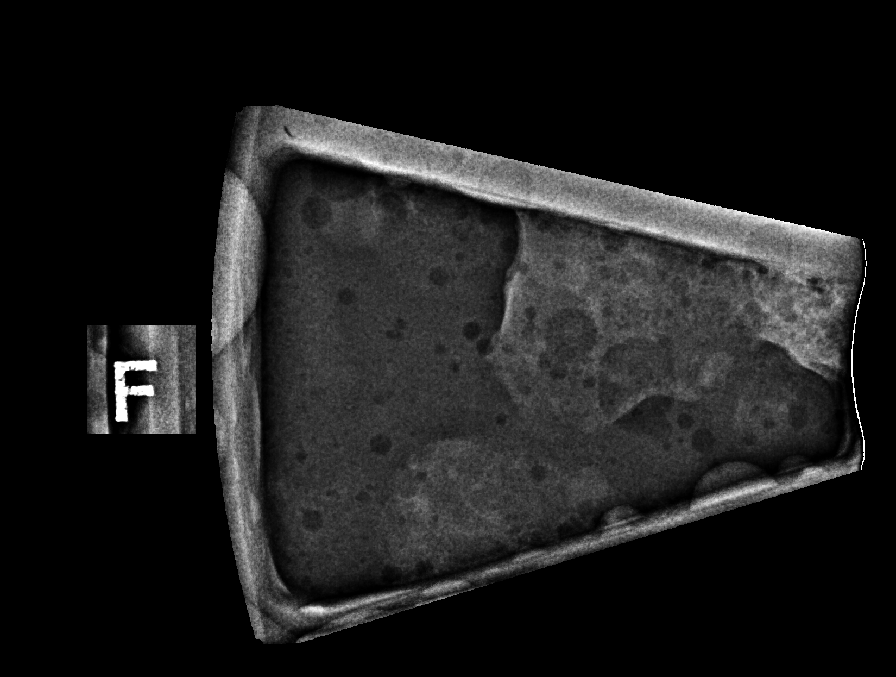

[R (8 of 8)]
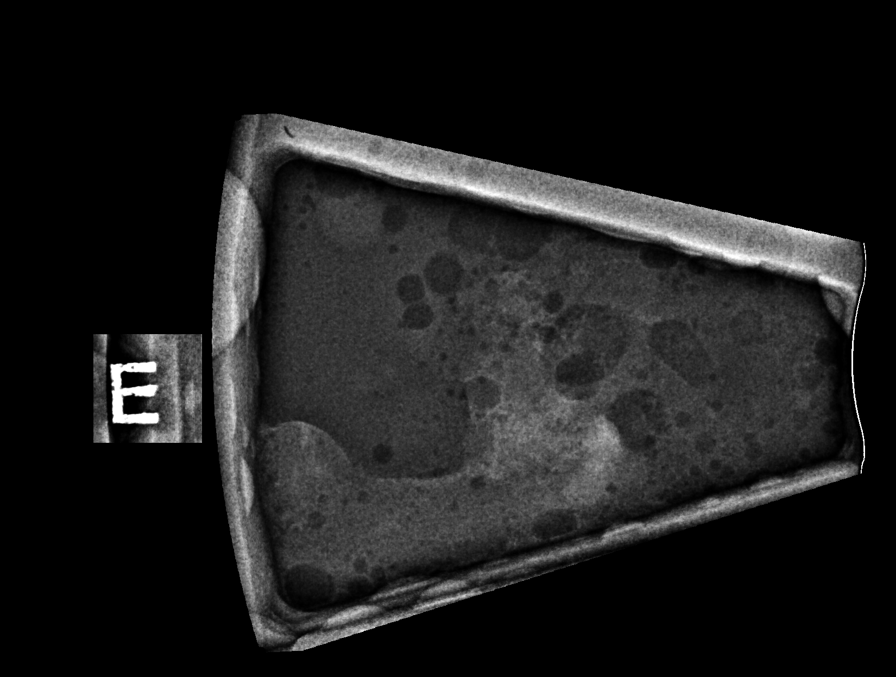

[8 of 39 positions shown; findings below may reference images not displayed]



Using sterile technique and 1% Lidocaine as local anesthetic, under
stereotactic guidance, a 9 gauge vacuum assisted device was used to
perform core needle biopsy of a mass in the upper outer right breast
using a MLO approach.

Lesion quadrant: Upper-outer

At the conclusion of the procedure, coil shaped tissue marker clip
was deployed into the biopsy cavity. Follow-up 2-view mammogram was
performed and dictated separately.
IMPRESSION: Stereotactic-guided biopsy of a right breast mass. No apparent
complications.

Of note, the mass was much less conspicuous during today's biopsy
compared to previous imaging.

ADDENDUM:
Pathology revealed PSEUDOANGIOMATOUS STROMAL HYPERPLASIA (PASH),
FIBROCYSTIC CHANGES of the Right breast, upper outer. This was found
to be concordant by Dr. S Ace Adoniram.

Pathology results were discussed with the patient by telephone. The
patient reported doing well after the biopsy with tenderness and
bleeding at the site. Post biopsy instructions and care were
reviewed and questions were answered. The patient was encouraged to
call The [REDACTED] for any additional
concerns.

The patient was asked to return for Right diagnostic mammography and
ultrasound in 6 months and informed a reminder notice would be sent
regarding this appointment.

Pathology results reported by Kissis Giannareas, RN on 09/10/2019.



Using sterile technique and 1% Lidocaine as local anesthetic, under
stereotactic guidance, a 9 gauge vacuum assisted device was used to
perform core needle biopsy of a mass in the upper outer right breast
using a MLO approach.

Lesion quadrant: Upper-outer

At the conclusion of the procedure, coil shaped tissue marker clip
was deployed into the biopsy cavity. Follow-up 2-view mammogram was
performed and dictated separately.
IMPRESSION: Stereotactic-guided biopsy of a right breast mass. No apparent
complications.

Of note, the mass was much less conspicuous during today's biopsy
compared to previous imaging.

## 2021-12-29 LAB — HM PAP SMEAR: HM Pap smear: NORMAL

## 2023-11-07 LAB — HM PAP SMEAR

## 2024-04-16 ENCOUNTER — Emergency Department (HOSPITAL_BASED_OUTPATIENT_CLINIC_OR_DEPARTMENT_OTHER)
Admission: EM | Admit: 2024-04-16 | Discharge: 2024-04-16 | Discharge status: Against Medical Advice | Attending: Emergency Medicine | Admitting: Emergency Medicine

## 2024-04-16 ENCOUNTER — Other Ambulatory Visit: Payer: Self-pay

## 2024-04-16 ENCOUNTER — Emergency Department (HOSPITAL_COMMUNITY)
Admission: EM | Admit: 2024-04-16 | Discharge: 2024-04-17 | Discharge status: Against Medical Advice | Attending: Emergency Medicine | Admitting: Emergency Medicine

## 2024-04-16 ENCOUNTER — Emergency Department (HOSPITAL_COMMUNITY)

## 2024-04-16 DIAGNOSIS — M542 Cervicalgia: Secondary | ICD-10-CM | POA: Diagnosis not present

## 2024-04-16 DIAGNOSIS — Z5321 Procedure and treatment not carried out due to patient leaving prior to being seen by health care provider: Secondary | ICD-10-CM | POA: Insufficient documentation

## 2024-04-16 DIAGNOSIS — R519 Headache, unspecified: Secondary | ICD-10-CM | POA: Insufficient documentation

## 2024-04-16 LAB — COMPREHENSIVE METABOLIC PANEL WITH GFR
ALT: 22 U/L (ref 0–44)
AST: 24 U/L (ref 15–41)
Albumin: 3.6 g/dL (ref 3.5–5.0)
Alkaline Phosphatase: 62 U/L (ref 38–126)
Anion gap: 12 (ref 5–15)
BUN: 9 mg/dL (ref 6–20)
CO2: 21 mmol/L — ABNORMAL LOW (ref 22–32)
Calcium: 8.5 mg/dL — ABNORMAL LOW (ref 8.9–10.3)
Chloride: 104 mmol/L (ref 98–111)
Creatinine, Ser: 0.85 mg/dL (ref 0.44–1.00)
GFR, Estimated: >60 mL/min (ref >60)
Glucose, Bld: 184 mg/dL — ABNORMAL HIGH (ref 70–99)
Potassium: 3.7 mmol/L (ref 3.5–5.1)
Sodium: 137 mmol/L (ref 135–145)
Total Bilirubin: 0.5 mg/dL (ref 0.0–1.2)
Total Protein: 6.4 g/dL — ABNORMAL LOW (ref 6.5–8.1)

## 2024-04-16 LAB — CBC WITH DIFFERENTIAL/PLATELET
Abs Immature Granulocytes: 0.04 K/uL (ref 0.00–0.07)
Basophils Absolute: 0 K/uL (ref 0.0–0.1)
Basophils Relative: 0 %
Eosinophils Absolute: 0 K/uL (ref 0.0–0.5)
Eosinophils Relative: 0 %
HCT: 38.8 % (ref 36.0–46.0)
Hemoglobin: 12.9 g/dL (ref 12.0–15.0)
Immature Granulocytes: 0 %
Lymphocytes Relative: 6 %
Lymphs Abs: 0.6 K/uL — ABNORMAL LOW (ref 0.7–4.0)
MCH: 29 pg (ref 26.0–34.0)
MCHC: 33.2 g/dL (ref 30.0–36.0)
MCV: 87.2 fL (ref 80.0–100.0)
Monocytes Absolute: 0.1 K/uL (ref 0.1–1.0)
Monocytes Relative: 1 %
Neutro Abs: 10 K/uL — ABNORMAL HIGH (ref 1.7–7.7)
Neutrophils Relative %: 93 %
Platelets: 240 K/uL (ref 150–400)
RBC: 4.45 MIL/uL (ref 3.87–5.11)
RDW: 12.5 % (ref 11.5–15.5)
WBC: 10.7 K/uL — ABNORMAL HIGH (ref 4.0–10.5)
nRBC: 0 % (ref 0.0–0.2)

## 2024-04-16 LAB — HCG, SERUM, QUALITATIVE: Preg, Serum: NEGATIVE

## 2024-04-16 NOTE — ED Provider Triage Note (Signed)
 Emergency Medicine Provider Triage Evaluation Note  Aidan Caloca , a 48 y.o. female  was evaluated in triage.  Pt complains of headache since yesterday.  No injury trauma or fall.  No numbness tingling or focal deficits.  Has no history of headache..  Review of Systems  Positive: HA Negative: Neck pain, fever  Physical Exam  BP 130/86 (BP Location: Left Arm)   Pulse 81   Temp 98.8 F (37.1 C)   Resp 18   SpO2 98%  Gen:   Awake, no distress   Resp:  Normal effort  MSK:   Moves extremities without difficulty  Other:    Medical Decision Making  Medically screening exam initiated at 8:52 PM.  Appropriate orders placed.  Meryn Sarracino was informed that the remainder of the evaluation will be completed by another provider, this initial triage assessment does not replace that evaluation, and the importance of remaining in the ED until their evaluation is complete.     Shermon Warren SAILOR, [place] 04/16/24 2052

## 2024-04-16 NOTE — ED Notes (Signed)
No answer when called for a treatment room. 

## 2024-04-16 NOTE — ED Triage Notes (Signed)
 PT HAS HAD HA SINCE YESTERDAY NOT RELIEVED BY MEDS. WAS SEEN AT [facility] AND  [facility]. PT RECEIVED MEDS AT [facility] AND PAIN IS UNRELIEVED. NO NEURO SYMPTOMS. PT STATES SHE ALSO HAS BLOOD IN LEFT EAR. NO INJURY

## 2024-04-16 NOTE — ED Triage Notes (Addendum)
 C/o persistent headache since yesterday around noon. Denies any deficits. Reports worse pain in neck when moving fast. Denies neck stiffness. Seen at [facility] and was given steroid, Toradol, and Zofran   Noticed some blood in R ear.

## 2024-04-16 NOTE — ED Notes (Signed)
 Left from lobby. Witnessed by registration.

## 2024-04-17 NOTE — ED Notes (Signed)
 Patient left AMA.

## 2024-04-17 NOTE — ED Notes (Signed)
Patient called for vitals 

## 2024-04-18 ENCOUNTER — Ambulatory Visit: Admitting: Family Medicine

## 2024-04-19 ENCOUNTER — Encounter: Payer: Self-pay | Admitting: Student in an Organized Health Care Education/Training Program

## 2024-04-19 ENCOUNTER — Ambulatory Visit (INDEPENDENT_AMBULATORY_CARE_PROVIDER_SITE_OTHER): Admitting: Student in an Organized Health Care Education/Training Program

## 2024-04-19 VITALS — BP 130/85 | HR 72 | Wt 162.0 lb

## 2024-04-19 DIAGNOSIS — E039 Hypothyroidism, unspecified: Secondary | ICD-10-CM | POA: Diagnosis not present

## 2024-04-19 DIAGNOSIS — Z131 Encounter for screening for diabetes mellitus: Secondary | ICD-10-CM | POA: Diagnosis not present

## 2024-04-19 DIAGNOSIS — F32A Depression, unspecified: Secondary | ICD-10-CM

## 2024-04-19 DIAGNOSIS — G44209 Tension-type headache, unspecified, not intractable: Secondary | ICD-10-CM | POA: Diagnosis not present

## 2024-04-19 DIAGNOSIS — Z13220 Encounter for screening for lipoid disorders: Secondary | ICD-10-CM | POA: Diagnosis not present

## 2024-04-19 DIAGNOSIS — F419 Anxiety disorder, unspecified: Secondary | ICD-10-CM | POA: Diagnosis not present

## 2024-04-19 DIAGNOSIS — R3129 Other microscopic hematuria: Secondary | ICD-10-CM | POA: Diagnosis not present

## 2024-04-19 LAB — URINALYSIS, ROUTINE W REFLEX MICROSCOPIC
Bilirubin Urine: NEGATIVE
Hgb urine dipstick: NEGATIVE
Ketones, ur: NEGATIVE
Leukocytes,Ua: NEGATIVE
Nitrite: NEGATIVE
RBC / HPF: NONE SEEN (ref 0–?)
Specific Gravity, Urine: 1.01 (ref 1.000–1.030)
Total Protein, Urine: NEGATIVE
Urine Glucose: NEGATIVE
Urobilinogen, UA: 0.2 (ref 0.0–1.0)
pH: 6.5 (ref 5.0–8.0)

## 2024-04-19 LAB — LIPID PANEL
Cholesterol: 187 mg/dL (ref 0–200)
HDL: 62.6 mg/dL (ref >39.00)
LDL Cholesterol: 99 mg/dL (ref 0–99)
NonHDL: 124.32
Total CHOL/HDL Ratio: 3
Triglycerides: 127 mg/dL (ref 0.0–149.0)
VLDL: 25.4 mg/dL (ref 0.0–40.0)

## 2024-04-19 LAB — HEMOGLOBIN A1C: Hgb A1c MFr Bld: 5.5 % (ref 4.6–6.5)

## 2024-04-19 LAB — TSH: TSH: 1.83 u[IU]/mL (ref 0.35–5.50)

## 2024-04-19 MED ORDER — ESCITALOPRAM OXALATE 20 MG PO TABS: 20.0000 mg | ORAL_TABLET | Freq: Every day | ORAL | Status: DC

## 2024-04-19 NOTE — Patient Instructions (Signed)
  VISIT SUMMARY: Today, you were seen for a severe headache and left ear bleeding. You have been experiencing a throbbing headache on the left side of your head for the past three days, which has impacted your daily activities. Two days ago, you also experienced bleeding from your left ear. A head CT and blood work done at urgent care were unremarkable. We discussed your medical history, including your back surgery, low thyroid , and history of hypoglycemia and gestational diabetes. You recently moved back to [place]  from [place], and you believe the change in climate may be affecting your symptoms.  YOUR PLAN: -TENSION-TYPE HEADACHE: Your headache is likely a tension-type headache, which can be caused by stress, muscle tension, or changes in your environment. It improves with anti-inflammatory medication. You should use NSAIDs like ibuprofen  or Aleve on bad days, but not for more than 2-3 consecutive days. I recommend neck exercises with a physiotherapist and lifestyle changes such as stress reduction, adequate sleep, regular exercise, hydration, and good nutrition. Please monitor for any worsening symptoms or signs of a more serious condition, such as fever or migraine features.  -HYPOTHYROIDISM: Hypothyroidism is a condition where your thyroid  gland does not produce enough thyroid  hormone. You are currently managing this with Synthroid 125 mcg daily, except Sundays. We will order thyroid  function tests to check your current thyroid  levels, as hyperreflexia may suggest elevated thyroid  levels.  -ELEVATED BLOOD SUGAR: Elevated blood sugar was noted in the emergency department, which could be related to stress. Given your history of gestational diabetes and hypoglycemia, we will order blood work to assess your current blood sugar levels and rule out diabetes.  INSTRUCTIONS: Please follow up with the recommended blood work for thyroid  function and blood sugar levels. Additionally, consider  scheduling an appointment with a physiotherapist for neck exercises. Monitor your symptoms and seek medical attention if you experience any worsening or high-risk symptoms.

## 2024-04-19 NOTE — Assessment & Plan Note (Signed)
 Chronic and stable.  Doing well currently on Lexapro  and bupropion .  Has a lot of stress with the recent move from [place] to [place].  We talked about stress management techniques.  Continue medication support.

## 2024-04-19 NOTE — Assessment & Plan Note (Signed)
 The headache is consistent with tension-type, possibly worsened by cervical spine arthritis, stress, and a recent move. It improves with anti-inflammatory medication. Advise using NSAIDs like ibuprofen  or Aleve on bad days, not exceeding 2-3 consecutive days. Encourage neck exercises with a physiotherapist. Recommended lifestyle modifications including stress reduction, adequate sleep, regular exercise, hydration, and nutrition. Instructed to monitor for worsening symptoms or high-risk symptoms such as fever or migraine features.

## 2024-04-19 NOTE — Assessment & Plan Note (Signed)
 Managed with Synthroid 125 mcg daily, except Sundays. Hyperreflexia may suggest elevated thyroid  levels. Ordered thyroid  function tests to assess current levels.

## 2024-04-19 NOTE — Assessment & Plan Note (Signed)
 In 2024 she had a history of microscopic hematuria while in [place].  This was worked up with imaging that did not reveal a culprit.  No tobacco use.  Will check urinalysis today.

## 2024-04-19 NOTE — Progress Notes (Signed)
 New Patient Office Visit  Subjective    Patient ID: Tamara Gates, female    DOB: 1975/08/13  Age: 48 y.o. MRN: [id number]  CC:  Chief Complaint  Patient presents with   Headache    Headaches, ear bleeding on left side had a Head Ct at ER was normal. No rupture but a litle bleeding in front of ear drum as well. Felt the need to follow up with a provider     HPI  Discussed the use of AI scribe software for clinical note transcription with the patient, who gave verbal consent to proceed.  History of Present Illness Tamara Gates is a 48 year old female who presents with a headache and left ear bleeding.  She has been experiencing a throbbing and severe headache primarily located on the left side of her head for the past three days, impacting her ability to perform daily activities such as cooking. Initially, the headache improved with an anti-inflammatory medication from a previous back surgery but has since returned. The headache was severe enough to wake her from sleep two nights ago but has slightly improved since. No neck pain, fever, chills, nausea, vomiting, photophobia, or changes in balance. She continues to work despite the headache.  Two days ago, she experienced bleeding from her left ear. She was evaluated at an urgent care where a head CT and blood work were performed, both of which were unremarkable. She denies any recent respiratory infections or ear pain.  Her past medical history includes a back surgery involving L4 and L5 fusion, low thyroid  for which she takes Synthroid, and a history of hypoglycemia and gestational diabetes during her first pregnancy. She is currently on Wellbutrin , Synthroid (75 mcg and 50 mcg to make a total of 125 mcg daily), Lexapro  20 mg, and Saxenda. No recent changes in her medications.  She recently moved back to [place]  from [place], where she lived for four years. She attributes some of her symptoms to the change in climate  from humid [place] to the drier climate in  . She denies any recent use of Q-tips beyond her normal routine of once a week.    Outpatient Encounter Medications as of 04/19/2024  Medication Sig   escitalopram  (LEXAPRO ) 20 MG tablet Take 1 tablet (20 mg total) by mouth daily.   levothyroxine (SYNTHROID) 125 MCG tablet Take 125 mcg by mouth daily before breakfast.   Liraglutide -Weight Management (SAXENDA) 18 MG/3ML SOPN Inject 0.6 mg into the skin daily.   buPROPion  (WELLBUTRIN  XL) 150 MG 24 hr tablet TAKE 1 TABLET BY MOUTH EVERY DAY   [DISCONTINUED] albuterol  (PROVENTIL  HFA;VENTOLIN  HFA) 108 (90 Base) MCG/ACT inhaler Inhale 2 puffs into the lungs every 6 (six) hours as needed for wheezing or shortness of breath.   [DISCONTINUED] BLISOVI 24 FE 1-20 MG-MCG(24) tablet Take 1 tablet by mouth daily.   [DISCONTINUED] Cholecalciferol (VITAMIN D  PO) Take by mouth.   [DISCONTINUED] citalopram  (CELEXA ) 40 MG tablet Take 1 tablet (40 mg total) by mouth daily.   [DISCONTINUED] fluticasone  (FLONASE ) 50 MCG/ACT nasal spray PLACE 1 SPRAY INTO BOTH NOSTRILS DAILY.   [DISCONTINUED] liothyronine (CYTOMEL) 5 MCG tablet    [DISCONTINUED] Probiotic Product (PROBIOTIC ADVANCED PO) Take by mouth.   [DISCONTINUED] Propylene Glycol (SYSTANE COMPLETE) 0.6 % SOLN Apply to eye as needed.   [DISCONTINUED] SYNTHROID 88 MCG tablet    No facility-administered encounter medications on file as of 04/19/2024.    Past Medical History:  Diagnosis Date   Depression  post partum   History of gestational diabetes    Thyroid  disease     Past Surgical History:  Procedure Laterality Date   BREAST REDUCTION SURGERY     MOUTH SURGERY     REDUCTION MAMMAPLASTY Bilateral 2008    Family History  Problem Relation Age of Onset   Cancer Mother        Breast   Osteoporosis Mother    Breast cancer Mother 51   Hypertension Father    Diabetes Father    Hyperlipidemia Father    Cancer Paternal Grandmother         colon   Dementia Paternal Grandmother    Cancer Paternal Grandfather        stomach        Objective    BP 130/85   Pulse 72   Wt 162 lb (73.5 kg)   SpO2 100%   BMI 26.96 kg/m   Physical Exam  Gen: Well-appearing woman Neuro: Alert, conversational, normal cranial nerves, full strength upper and lower extremities, hyperreflexic throughout with 3+ reflexes at patella and brachials, normal gait and balance Ears: In the left ear canal there is a small erosion with a small amount of dried blood right at the entrance to the canal, the tympanic membrane is normal, the rest of the canal is normal Neck: Normal thyroid , no nodules or adenopathy Heart: Regular, no murmur Lungs: Unlabored, clear throughout Ext: Warm, no edema Psych: Appropriate mood and affect, not anxious or depressed appearing       Assessment & Plan:   Problem List Items Addressed This Visit       High   Tension type headache - Primary (Chronic)   The headache is consistent with tension-type, possibly worsened by cervical spine arthritis, stress, and a recent move. It improves with anti-inflammatory medication. Advise using NSAIDs like ibuprofen  or Aleve on bad days, not exceeding 2-3 consecutive days. Encourage neck exercises with a physiotherapist. Recommended lifestyle modifications including stress reduction, adequate sleep, regular exercise, hydration, and nutrition. Instructed to monitor for worsening symptoms or high-risk symptoms such as fever or migraine features.      Relevant Medications   escitalopram  (LEXAPRO ) 20 MG tablet     Medium    Anxiety and depression (Chronic)   Chronic and stable.  Doing well currently on Lexapro  and bupropion .  Has a lot of stress with the recent move from [place] to [place].  We talked about stress management techniques.  Continue medication support.      Relevant Medications   escitalopram  (LEXAPRO ) 20 MG tablet   Hypothyroid (Chronic)   Managed with  Synthroid 125 mcg daily, except Sundays. Hyperreflexia may suggest elevated thyroid  levels. Ordered thyroid  function tests to assess current levels.      Relevant Medications   levothyroxine (SYNTHROID) 125 MCG tablet   Other Relevant Orders   TSH   Microscopic hematuria (Chronic)   In 2024 she had a history of microscopic hematuria while in [place].  This was worked up with imaging that did not reveal a culprit.  No tobacco use.  Will check urinalysis today.      Relevant Orders   Urinalysis, Routine w reflex microscopic   Other Visit Diagnoses       Screening for lipid disorders       Relevant Orders   Lipid panel     Screening for diabetes mellitus       Relevant Orders   Hemoglobin A1c       No  follow-ups on file.   Cleatus Debby Specking, MD

## 2024-04-22 ENCOUNTER — Ambulatory Visit: Payer: Self-pay | Admitting: Student in an Organized Health Care Education/Training Program

## 2024-04-30 ENCOUNTER — Ambulatory Visit: Admitting: Student in an Organized Health Care Education/Training Program

## 2024-04-30 ENCOUNTER — Encounter: Payer: Self-pay | Admitting: Student in an Organized Health Care Education/Training Program

## 2024-04-30 VITALS — BP 142/87 | HR 72 | Ht 64.0 in | Wt 158.0 lb

## 2024-04-30 DIAGNOSIS — E039 Hypothyroidism, unspecified: Secondary | ICD-10-CM | POA: Diagnosis not present

## 2024-04-30 DIAGNOSIS — F419 Anxiety disorder, unspecified: Secondary | ICD-10-CM

## 2024-04-30 DIAGNOSIS — Z309 Encounter for contraceptive management, unspecified: Secondary | ICD-10-CM | POA: Insufficient documentation

## 2024-04-30 DIAGNOSIS — Z308 Encounter for other contraceptive management: Secondary | ICD-10-CM

## 2024-04-30 DIAGNOSIS — Z1211 Encounter for screening for malignant neoplasm of colon: Secondary | ICD-10-CM

## 2024-04-30 DIAGNOSIS — L649 Androgenic alopecia, unspecified: Secondary | ICD-10-CM

## 2024-04-30 DIAGNOSIS — F32A Depression, unspecified: Secondary | ICD-10-CM

## 2024-04-30 DIAGNOSIS — G44209 Tension-type headache, unspecified, not intractable: Secondary | ICD-10-CM

## 2024-04-30 DIAGNOSIS — E663 Overweight: Secondary | ICD-10-CM

## 2024-04-30 DIAGNOSIS — Z1231 Encounter for screening mammogram for malignant neoplasm of breast: Secondary | ICD-10-CM

## 2024-04-30 MED ORDER — MINOXIDIL 2.5 MG PO TABS: 10.0000 mg | ORAL_TABLET | Freq: Every day | ORAL | 3 refills | Status: DC

## 2024-04-30 MED ORDER — CYCLOBENZAPRINE HCL 5 MG PO TABS: 5.0000 mg | ORAL_TABLET | Freq: Every evening | ORAL | 1 refills | Status: AC | PRN

## 2024-04-30 MED ORDER — BUPROPION HCL ER (XL) 150 MG PO TB24: 150.0000 mg | ORAL_TABLET | Freq: Every day | ORAL | 3 refills | Status: AC

## 2024-04-30 MED ORDER — NORGESTIMATE-ETH ESTRADIOL 0.25-35 MG-MCG PO TABS: 1.0000 | ORAL_TABLET | Freq: Every day | ORAL | 11 refills | Status: AC

## 2024-04-30 MED ORDER — LIRAGLUTIDE -WEIGHT MANAGEMENT 18 MG/3ML ~~LOC~~ SOPN: 1.8000 mg | PEN_INJECTOR | Freq: Every day | SUBCUTANEOUS | 5 refills | Status: DC

## 2024-04-30 MED ORDER — LEVOTHYROXINE SODIUM 125 MCG PO TABS: 125.0000 ug | ORAL_TABLET | Freq: Every day | ORAL | 3 refills | Status: AC

## 2024-04-30 NOTE — Assessment & Plan Note (Signed)
 Hair loss is managed with oral minoxidil 2.5 mg daily since February, with improvement and no side effects. Continue oral minoxidil 2.5 mg daily.

## 2024-04-30 NOTE — Assessment & Plan Note (Signed)
 Chronic tension-type headaches are exacerbated by physical activity, originating in the neck and radiating to the back of the head, without vomiting, photophobia, or temple pain. Prescribed Flexeril  at night for neck tension and headaches. Advised against driving after taking Flexeril  due to drowsiness and against combining it with alcohol.

## 2024-04-30 NOTE — Assessment & Plan Note (Signed)
 She is on liraglutide 1.8 mg daily for weight maintenance, with no diabetes but a family history of diabetes and gestational diabetes. There is concern about weight gain if therapy is discontinued. Prescribed liraglutide 1.8 mg daily.

## 2024-04-30 NOTE — Assessment & Plan Note (Addendum)
 She uses Mercilon for contraception and perimenopausal symptoms but requires substitution with Millie as Mercilon is unavailable in the [place] . Prescribe Mili, a low-dose combined oral contraceptive, as a substitute for Mercilon.

## 2024-04-30 NOTE — Assessment & Plan Note (Signed)
 Chronic anxiety and depression are managed with escitalopram  and bupropion . High stress is noted due to recent relocation and life changes. Refill escitalopram  20 mg daily and bupropion  150 mg daily.

## 2024-04-30 NOTE — Progress Notes (Signed)
 Established Patient Office Visit  Subjective   Patient ID: Tamara Gates, female    DOB: 08/07/1975  Age: 48 y.o. MRN: [id number]  Chief Complaint  Patient presents with   Transitions Of Care    Go over refill of medication   Mammogram- patient has a copy Pap smear- patient has a copy  Colonoscopy- not recently completed     HPI  Discussed the use of AI scribe software for clinical note transcription with the patient, who gave verbal consent to proceed.  History of Present Illness Tamara Gates is a 48 year old female who presents with persistent headaches exacerbated by physical activity.  She experiences persistent headaches that worsen with physical activity, particularly after playing tennis. The pain originates in the neck and radiates to the back of the head. The headaches last for days but do not limit her daily activities. She has tried Tylenol and anti-inflammatory medications with limited success, and topical treatments like Icy Hot have not provided relief. She has not used muscle relaxers before. No vomiting, photophobia, or headaches in the temples. She describes her stress as 'terrible' and states that it is present.  She is currently taking oral minoxidil 2.5 mg daily for hair loss, which she started in February 2025, with no side effects and perceived improvement. She was also prescribed iron and zinc supplements due to low levels.  She has been using liraglutide (Saxenda) for weight management for about a year and a half, initially at 3.0 mg but currently at 1.8 mg daily to maintain her weight. She brought her supply from [place] and is unsure about obtaining it in the [place] . She has a history of gestational diabetes and a family history of diabetes, but her A1c was normal at 5.5% last September.  She takes Lexapro , Synthroid, and Wellbutrin , each with about a month's supply remaining. She has been on Synthroid for over ten years for thyroid  management, with a  TSH level of 1.8 indicating a stable dose. She also uses Mercilon, a low-dose oral contraceptive, which she has been on for four years.  Her family history includes a father with diabetes and a grandmother with colon cancer. She had a colonoscopy in 2020 with polyp removal, but she does not recall the follow-up recommendations.  She has lived internationally in [place], [place], and [place] due to her husband's work. She has two children, a 39 year old in college and a 50 year old entering high school.     Objective:     BP (!) 142/87   Pulse 72   Ht 5' 4 (1.626 m)   Wt 158 lb (71.7 kg)   BMI 27.12 kg/m   Physical Exam  Gen: Well-appearing woman Neck: No thyromegaly, nodules or adenopathy Heart: Regular, no murmur Lungs: Unlabored, clear throughout Ext: Warm, no edema MSK: Tenderness over the left side of her cervical paraspinal muscles as they insert onto the occiput    Assessment & Plan:    Problem List Items Addressed This Visit       High   Tension type headache - Primary (Chronic)   Chronic tension-type headaches are exacerbated by physical activity, originating in the neck and radiating to the back of the head, without vomiting, photophobia, or temple pain. Prescribed Flexeril  at night for neck tension and headaches. Advised against driving after taking Flexeril  due to drowsiness and against combining it with alcohol.      Relevant Medications   buPROPion  (WELLBUTRIN  XL) 150 MG 24 hr tablet   cyclobenzaprine  (FLEXERIL ) 5  MG tablet     Medium    Anxiety and depression (Chronic)   Chronic anxiety and depression are managed with escitalopram  and bupropion . High stress is noted due to recent relocation and life changes. Refill escitalopram  20 mg daily and bupropion  150 mg daily.      Relevant Medications   buPROPion  (WELLBUTRIN  XL) 150 MG 24 hr tablet   Hypothyroid (Chronic)   Chronic hypothyroidism is well-managed with levothyroxine. A recent TSH level of 1.8  indicates good control. Refill levothyroxine 125 mcg daily.      Relevant Medications   levothyroxine (SYNTHROID) 125 MCG tablet   Overweight (BMI 25.0-29.9) (Chronic)   She is on liraglutide 1.8 mg daily for weight maintenance, with no diabetes but a family history of diabetes and gestational diabetes. There is concern about weight gain if therapy is discontinued. Prescribed liraglutide 1.8 mg daily.       Relevant Medications   Liraglutide -Weight Management (SAXENDA) 18 MG/3ML SOPN     Low   Androgenic alopecia (Chronic)   Hair loss is managed with oral minoxidil 2.5 mg daily since February, with improvement and no side effects. Continue oral minoxidil 2.5 mg daily.      Relevant Medications   minoxidil (LONITEN) 2.5 MG tablet   Encounter for birth control (Chronic)   She uses Mercilon for contraception and perimenopausal symptoms but requires substitution with Millie as Mercilon is unavailable in the [place] . Prescribe Mili, a low-dose combined oral contraceptive, as a substitute for Mercilon.      Relevant Medications   norgestimate-ethinyl estradiol (MILI) 0.25-35 MG-MCG tablet   Other Visit Diagnoses       Screening for colon cancer       Relevant Orders   Fecal occult blood, imunochemical     Screening mammogram for breast cancer       Relevant Orders   MM Digital Screening       Return in about 6 months (around 10/28/2024).    Cleatus Debby Specking, MD

## 2024-04-30 NOTE — Assessment & Plan Note (Signed)
 Chronic hypothyroidism is well-managed with levothyroxine. A recent TSH level of 1.8 indicates good control. Refill levothyroxine 125 mcg daily.

## 2024-05-01 NOTE — Telephone Encounter (Signed)
 Patient is needing a PA on saxenda

## 2024-05-02 ENCOUNTER — Other Ambulatory Visit (HOSPITAL_COMMUNITY): Payer: Self-pay

## 2024-05-02 ENCOUNTER — Telehealth: Payer: Self-pay

## 2024-05-02 NOTE — Telephone Encounter (Signed)
 Pharmacy Patient Advocate Encounter   Received notification from Physician's Office that prior authorization for Saxenda 18MG /3ML pen-injectors is required/requested.   Insurance verification completed.   The patient is insured through [facility].   Per test claim: PA required; PA submitted to above mentioned insurance via Latent Key/confirmation #/EOC [id number] Status is pending

## 2024-05-03 NOTE — Telephone Encounter (Signed)
 PA for Saxenda was denied. Alternative?

## 2024-05-03 NOTE — Telephone Encounter (Signed)
 Pharmacy Patient Advocate Encounter  Received notification from [facility] that Prior Authorization for  Saxenda 18MG /3ML pen-injectors has been DENIED.  Full denial letter will be uploaded to the media tab. See denial reason below.   PA #/Case ID/Reference #: [id number]

## 2024-05-06 ENCOUNTER — Other Ambulatory Visit (HOSPITAL_COMMUNITY): Payer: Self-pay

## 2024-05-06 ENCOUNTER — Other Ambulatory Visit (INDEPENDENT_AMBULATORY_CARE_PROVIDER_SITE_OTHER)

## 2024-05-06 DIAGNOSIS — Z1211 Encounter for screening for malignant neoplasm of colon: Secondary | ICD-10-CM

## 2024-05-06 LAB — FECAL OCCULT BLOOD, IMMUNOCHEMICAL: Fecal Occult Bld: NEGATIVE

## 2024-05-07 ENCOUNTER — Encounter: Admitting: Student in an Organized Health Care Education/Training Program

## 2024-05-07 ENCOUNTER — Ambulatory Visit: Payer: Self-pay | Admitting: Student in an Organized Health Care Education/Training Program

## 2024-05-20 ENCOUNTER — Encounter: Payer: Self-pay | Admitting: Student in an Organized Health Care Education/Training Program

## 2024-05-20 ENCOUNTER — Other Ambulatory Visit: Payer: Self-pay

## 2024-05-20 DIAGNOSIS — E663 Overweight: Secondary | ICD-10-CM

## 2024-05-20 MED ORDER — ESCITALOPRAM OXALATE 20 MG PO TABS: 20.0000 mg | ORAL_TABLET | Freq: Every day | ORAL | 1 refills | Status: AC

## 2024-05-21 MED ORDER — LIRAGLUTIDE -WEIGHT MANAGEMENT 18 MG/3ML ~~LOC~~ SOPN: 1.8000 mg | PEN_INJECTOR | Freq: Every day | SUBCUTANEOUS | 5 refills | Status: AC

## 2024-05-24 NOTE — Telephone Encounter (Signed)
 Okay for Saxenda to be sent to [facility]?

## 2024-05-24 NOTE — Telephone Encounter (Signed)
 I sent the Saxenda medication to the requested [facility] on 10/21.  I called the patient and gave her that update.

## 2024-05-27 NOTE — Telephone Encounter (Signed)
 Rcvd PA paperwork for Saxenda from patients insurance. Placed on providers desk

## 2024-05-27 NOTE — Telephone Encounter (Signed)
 PA completed.  Placed in MA basket.  Thank you.

## 2024-05-28 NOTE — Telephone Encounter (Signed)
 Forms faxed to [facility] and placed in scan folder

## 2024-05-31 ENCOUNTER — Other Ambulatory Visit (HOSPITAL_COMMUNITY): Payer: Self-pay

## 2024-06-25 ENCOUNTER — Other Ambulatory Visit: Payer: Self-pay | Admitting: Student in an Organized Health Care Education/Training Program

## 2024-06-25 DIAGNOSIS — L649 Androgenic alopecia, unspecified: Secondary | ICD-10-CM

## 2024-10-25 ENCOUNTER — Ambulatory Visit: Admitting: Student in an Organized Health Care Education/Training Program
